# Patient Record
Sex: Male | Born: 1955 | Race: White | Hispanic: No | Marital: Married | Smoking: Former smoker
Health system: Southern US, Community
[De-identification: ages and names within clinical notes are randomized; demographics above are authoritative.]

## PROBLEM LIST (undated history)

## (undated) DIAGNOSIS — I213 ST elevation (STEMI) myocardial infarction of unspecified site: Secondary | ICD-10-CM

## (undated) DIAGNOSIS — K8309 Other cholangitis: Secondary | ICD-10-CM

## (undated) DIAGNOSIS — N289 Disorder of kidney and ureter, unspecified: Secondary | ICD-10-CM

## (undated) DIAGNOSIS — K8050 Calculus of bile duct without cholangitis or cholecystitis without obstruction: Secondary | ICD-10-CM

## (undated) DIAGNOSIS — E119 Type 2 diabetes mellitus without complications: Secondary | ICD-10-CM

## (undated) DIAGNOSIS — E079 Disorder of thyroid, unspecified: Secondary | ICD-10-CM

## (undated) DIAGNOSIS — T7840XA Allergy, unspecified, initial encounter: Secondary | ICD-10-CM

## (undated) DIAGNOSIS — I10 Essential (primary) hypertension: Secondary | ICD-10-CM

## (undated) DIAGNOSIS — I2510 Atherosclerotic heart disease of native coronary artery without angina pectoris: Secondary | ICD-10-CM

## (undated) DIAGNOSIS — D751 Secondary polycythemia: Secondary | ICD-10-CM

## (undated) DIAGNOSIS — M1990 Unspecified osteoarthritis, unspecified site: Secondary | ICD-10-CM

## (undated) DIAGNOSIS — Z87442 Personal history of urinary calculi: Secondary | ICD-10-CM

## (undated) DIAGNOSIS — E785 Hyperlipidemia, unspecified: Secondary | ICD-10-CM

## (undated) HISTORY — DX: Type 2 diabetes mellitus without complications: E11.9

## (undated) HISTORY — DX: Unspecified osteoarthritis, unspecified site: M19.90

## (undated) HISTORY — DX: Essential (primary) hypertension: I10

## (undated) HISTORY — DX: Disorder of thyroid, unspecified: E07.9

## (undated) HISTORY — DX: Hyperlipidemia, unspecified: E78.5

## (undated) HISTORY — DX: Atherosclerotic heart disease of native coronary artery without angina pectoris: I25.10

## (undated) HISTORY — PX: CHOLECYSTECTOMY: SHX55

## (undated) HISTORY — PX: COLONOSCOPY: SHX174

## (undated) HISTORY — DX: Allergy, unspecified, initial encounter: T78.40XA

## (undated) HISTORY — PX: UPPER GASTROINTESTINAL ENDOSCOPY: SHX188

## (undated) HISTORY — DX: Personal history of urinary calculi: Z87.442

---
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
## 1997-09-19 HISTORY — PX: KNEE ARTHROSCOPY: SHX127

## 2010-09-19 HISTORY — PX: CORONARY STENT PLACEMENT: SHX1402

## 2012-10-04 ENCOUNTER — Ambulatory Visit (HOSPITAL_COMMUNITY)
Admission: RE | Admit: 2012-10-04 | Discharge: 2012-10-04 | Disposition: A | Discharge status: Home / Self Care | Payer: BC Managed Care – PPO | Source: Ambulatory Visit | Attending: Internal Medicine | Admitting: Internal Medicine

## 2012-10-04 ENCOUNTER — Other Ambulatory Visit (HOSPITAL_COMMUNITY): Payer: Self-pay | Admitting: Internal Medicine

## 2012-10-04 DIAGNOSIS — R0602 Shortness of breath: Secondary | ICD-10-CM | POA: Insufficient documentation

## 2012-10-04 DIAGNOSIS — R05 Cough: Secondary | ICD-10-CM | POA: Insufficient documentation

## 2012-10-04 DIAGNOSIS — R059 Cough, unspecified: Secondary | ICD-10-CM | POA: Insufficient documentation

## 2013-07-30 DIAGNOSIS — I2510 Atherosclerotic heart disease of native coronary artery without angina pectoris: Secondary | ICD-10-CM | POA: Diagnosis present

## 2013-11-29 DIAGNOSIS — E119 Type 2 diabetes mellitus without complications: Secondary | ICD-10-CM

## 2018-02-28 ENCOUNTER — Encounter: Payer: Self-pay | Admitting: Internal Medicine

## 2018-04-04 ENCOUNTER — Encounter: Payer: Self-pay | Admitting: Nurse Practitioner

## 2018-04-20 ENCOUNTER — Ambulatory Visit (INDEPENDENT_AMBULATORY_CARE_PROVIDER_SITE_OTHER): Payer: BC Managed Care – PPO | Admitting: Nurse Practitioner

## 2018-04-20 ENCOUNTER — Encounter: Payer: Self-pay | Admitting: Nurse Practitioner

## 2018-04-20 ENCOUNTER — Telehealth: Payer: Self-pay

## 2018-04-20 VITALS — BP 120/74 | HR 78 | Ht 74.0 in | Wt 243.2 lb

## 2018-04-20 DIAGNOSIS — R194 Change in bowel habit: Secondary | ICD-10-CM

## 2018-04-20 DIAGNOSIS — Z1211 Encounter for screening for malignant neoplasm of colon: Secondary | ICD-10-CM

## 2018-04-20 DIAGNOSIS — Z7901 Long term (current) use of anticoagulants: Secondary | ICD-10-CM | POA: Diagnosis not present

## 2018-04-20 DIAGNOSIS — Z800 Family history of malignant neoplasm of digestive organs: Secondary | ICD-10-CM

## 2018-04-20 MED ORDER — NA SULFATE-K SULFATE-MG SULF 17.5-3.13-1.6 GM/177ML PO SOLN: ORAL | 0 refills | Status: DC

## 2018-04-20 NOTE — Progress Notes (Signed)
Primary GI:   New             Chief Complaint: Bowel changes   Referring Provider:     Antonietta Jewel, MD   ASSESSMENT AND PLAN;   71. 62 yo male with [facility] of colon cancer in mother age 49. Patient has never had a colonoscopy.  Patient will be scheduled for an initial screening colonoscopy, off Plavix. The risks and benefits of colonoscopy with possible polypectomy were discussed and the patient agrees to proceed.   2.  CAD / remote stent. On Plavix.  -Hold Plavix for 5 days before procedure - will instruct when and how to resume after procedure. Patient understands that there is a low but real risk of cardiovascular event such as heart attack, stroke, or embolism /  thrombosis, or ischemia while off Plavix. The patient consents to proceed. Will communicate by phone or EMR with patient's prescribing provider to confirm that holding Plavix is reasonable in this case.   3. BLE edema. No diuretics listed on home med list but upon furthering questioning patient thinks he takes one and will call us when checks meds at home.    4. Bowel changes. Stools softer than his norm and with some associated urgency.  -further evaluation at time of colonoscopy. No alarming features at present. His abdominal exam is benign, no unexplained weight loss , fevers, blood in stool, etc.. I develops diarrhea then will need stools studies.    HPI:    Patient is a 62 yo male with DM, HTN, CAD / stents on chronic plavix.  In the last two months his stools have become soft with associated urgency. He had a cholecytectomy May 2018 but bowel movements didn't change until just recently. He has no abdominal pain. No N/V to speak of. Weight is stable.  No antibiotics in at least a year. No new medcations / changes in routine meds. No dietary changes. On Meformin for 15 years. On chronic pain meds for knee.  Past Medical History:  Diagnosis Date  . Arthritis   . Coronary artery disease   . Diabetes ([place])   . History of  kidney stones   . Hypertension     Past Surgical History:  Procedure Laterality Date  . CHOLECYSTECTOMY    . CORONARY STENT PLACEMENT  2012   Family History  Problem Relation Age of Onset  . Colon cancer Mother   . Colon polyps Mother   . Kidney cancer Father   . Heart disease Father        Dads brother had heart disease  . Liver cancer Neg Hx   . Rectal cancer Neg Hx   . Esophageal cancer Neg Hx   . Stomach cancer Neg Hx    Social History   Tobacco Use  . Smoking status: Former Research scientist (life sciences)  . Smokeless tobacco: Never Used  Substance Use Topics  . Alcohol use: Not on file    Comment: social 5 drinks yearly  . Drug use: Never   Current Outpatient Medications  Medication Sig Dispense Refill  . atenolol (TENORMIN) 100 MG tablet Take 100 mg by mouth daily.    Marland Kitchen atorvastatin (LIPITOR) 80 MG tablet Take 80 mg by mouth daily.    . Cholecalciferol (VITAMIN D3) 50000 units TABS Take 50,000 Units by mouth once a week.    . clopidogrel (PLAVIX) 75 MG tablet Take 75 mg by mouth daily.    Marland Kitchen HYDROcodone-acetaminophen (NORCO) 10-325 MG tablet Take 1 tablet  by mouth every 6 (six) hours as needed.    . insulin detemir (LEVEMIR) 100 UNIT/ML injection Inject 50-100 Units into the skin daily.    Marland Kitchen lisinopril (PRINIVIL,ZESTRIL) 2.5 MG tablet Take 2.5 mg by mouth daily.    . metFORMIN (GLUCOPHAGE) 500 MG tablet Take by mouth 2 (two) times daily with a meal.    . testosterone cypionate (DEPOTESTOTERONE CYPIONATE) 100 MG/ML injection Inject 200 mg into the muscle every 14 (fourteen) days. For IM use only     No current facility-administered medications for this visit.    Allergies  Allergen Reactions  . Penicillins Rash    Review of Systems: All systems reviewed and negative except where noted in HPI.   Creatinine clearance cannot be calculated (No order found.)   Physical Exam:    Wt Readings from Last 3 Encounters:  04/20/18 243 lb 3.2 oz (110.3 kg)    BP 120/74   Pulse 78   Ht  6\' 2"  (1.88 m)   Wt 243 lb 3.2 oz (110.3 kg)   BMI 31.23 kg/m  Constitutional:  Pleasant male in no acute distress. Psychiatric: Normal mood and affect. Behavior is normal. EENT: Pupils normal.  Conjunctivae are normal. No scleral icterus. Neck supple.  Cardiovascular: Normal rate, regular rhythm.BLE 2-3+.  Pulmonary/chest: Effort normal and breath sounds normal. No wheezing, rales or rhonchi. Abdominal: Soft, nondistended, nontender. Bowel sounds active throughout. There are no masses palpable. No hepatomegaly. Neurological: Alert and oriented to person place and time. Skin: Skin is warm and dry. No rashes noted.  Tye Savoy, NP  04/20/2018, 2:03 PM   Cc: Antonietta Jewel, MD

## 2018-04-20 NOTE — Patient Instructions (Signed)
If you are age 60 or older, your body mass index should be between 23-30. Your Body mass index is 31.23 kg/m. If this is out of the aforementioned range listed, please consider follow up with your Primary Care Provider.  If you are age 41 or younger, your body mass index should be between 19-25. Your Body mass index is 31.23 kg/m. If this is out of the aformentioned range listed, please consider follow up with your Primary Care Provider.   You have been scheduled for a colonoscopy. Please follow written instructions given to you at your visit today.  Please pick up your prep supplies at the pharmacy within the next 1-3 days. If you use inhalers (even only as needed), please bring them with you on the day of your procedure. Your physician has requested that you go to www.startemmi.com and enter the access code given to you at your visit today. This web site gives a general overview about your procedure. However, you should still follow specific instructions given to you by our office regarding your preparation for the procedure.  We have sent the following medications to your pharmacy for you to pick up at your convenience: [facility] will be contacted by our office prior to your procedure for directions on holding your Plavix.  If you do not hear from our office 1 week prior to your scheduled procedure, please call [phone] to discuss.   Thank you for choosing me and [facility].   Tye Savoy, NP

## 2018-04-20 NOTE — Telephone Encounter (Signed)
[facility] [address] [place], [place]  [address] Phone:  [phone]   Fax:  [phone]  04/20/2018   RE:      Ricky Cabrera DOB:   07/25/1956 MRN:   [id number]   Dear Dr. Ralene Cork,    We have scheduled the above patient for an endoscopic procedure. Our records show that he is on anticoagulation therapy.   Please advise as to whether the patient may come off his therapy of Plavix 5 days prior to the colonoscopy procedure, which is scheduled for 05/03/18.  Please fax back/ or route to [place], [place] at [phone].   Sincerely,    Francesco Sor    PLEASE RESPOND ASAP! Thanks

## 2018-04-23 ENCOUNTER — Encounter: Payer: Self-pay | Admitting: Nurse Practitioner

## 2018-04-23 NOTE — Progress Notes (Signed)
Thank you for sending this case to me. I have reviewed the entire note, and the outlined plan seems appropriate.   Henry Danis, MD  

## 2018-04-25 ENCOUNTER — Encounter: Payer: BC Managed Care – PPO | Admitting: Gastroenterology

## 2018-04-26 ENCOUNTER — Telehealth: Payer: Self-pay

## 2018-04-26 NOTE — Telephone Encounter (Signed)
I have called patient and left voice mails on Monday, Tuesday, Wednesday and today requesting that he return my call regarding holding his Plavix.  I told him if I do not hear from back from him today, his procedure will be canceled for 05/03/18.  I called Dr. Ralene Cork office and got an alternate number [phone].  Left a voice mail requesting patient to call me today regarding holding his Plavix.  That I would have to cancel his procedure if I do not hear back today by 5 pm.

## 2018-04-26 NOTE — Progress Notes (Signed)
Patients wife returned my call regarding holding Plavix for five days.  I asked if I could speak with the patient.  She stated the patient wasn't at home.  I told her to please advise Mr Mccreadie to hold Plavix starting on Saturday and to hold until procedure. She verabalized understanding and stated she would let him know.  She also told me that patient is taking Furosemide 20 mg every morning.  I will add this to patients medication list.

## 2018-05-03 ENCOUNTER — Encounter: Payer: Self-pay | Admitting: Gastroenterology

## 2018-05-03 ENCOUNTER — Ambulatory Visit (AMBULATORY_SURGERY_CENTER): Payer: BC Managed Care – PPO | Admitting: Gastroenterology

## 2018-05-03 VITALS — BP 120/76 | HR 74 | Temp 96.2°F | Resp 19 | Ht 74.0 in | Wt 243.0 lb

## 2018-05-03 DIAGNOSIS — Z800 Family history of malignant neoplasm of digestive organs: Secondary | ICD-10-CM | POA: Diagnosis not present

## 2018-05-03 DIAGNOSIS — D125 Benign neoplasm of sigmoid colon: Secondary | ICD-10-CM | POA: Diagnosis not present

## 2018-05-03 DIAGNOSIS — D123 Benign neoplasm of transverse colon: Secondary | ICD-10-CM

## 2018-05-03 DIAGNOSIS — Z1211 Encounter for screening for malignant neoplasm of colon: Secondary | ICD-10-CM | POA: Diagnosis not present

## 2018-05-03 DIAGNOSIS — D124 Benign neoplasm of descending colon: Secondary | ICD-10-CM

## 2018-05-03 MED ORDER — SODIUM CHLORIDE 0.9 % IV SOLN: 500.0000 mL | Freq: Once | INTRAVENOUS | Status: DC

## 2018-05-03 NOTE — Progress Notes (Signed)
Called to room to assist during endoscopic procedure.  Patient ID and intended procedure confirmed with present staff. Received instructions for my participation in the procedure from the performing physician.  

## 2018-05-03 NOTE — Progress Notes (Signed)
Report given to PACU, vss 

## 2018-05-03 NOTE — Progress Notes (Signed)
History reviewed today 

## 2018-05-03 NOTE — Op Note (Signed)
[facility] Patient Name: Ricky Cabrera Procedure Date: 05/03/2018 9:03 AM MRN: [id number] Endoscopist: Mallie Mussel L. Loletha Carrow , MD Age: 62 Referring MD:  Date of Birth: 1956/08/09 Gender: Male Account #: [id number] Procedure:                Colonoscopy Indications:              Screening in patient at increased risk: Colorectal                            cancer in mother before age 44 Medicines:                Monitored Anesthesia Care Procedure:                Pre-Anesthesia Assessment:                           - Prior to the procedure, a History and Physical                            was performed, and patient medications and                            allergies were reviewed. The patient's tolerance of                            previous anesthesia was also reviewed. The risks                            and benefits of the procedure and the sedation                            options and risks were discussed with the patient.                            All questions were answered, and informed consent                            was obtained. Prior Anticoagulants: The patient has                            taken Plavix (clopidogrel), last dose was 5 days                            prior to procedure. ASA Grade Assessment: III - A                            patient with severe systemic disease. After                            reviewing the risks and benefits, the patient was                            deemed in satisfactory condition to undergo the  procedure.                           After obtaining informed consent, the colonoscope                            was passed under direct vision. Throughout the                            procedure, the patient's blood pressure, pulse, and                            oxygen saturations were monitored continuously. The                            Model CF-HQ190L [id number]) scope was introduced                             through the anus and advanced to the the cecum,                            identified by appendiceal orifice and ileocecal                            valve. The colonoscopy was performed with                            difficulty due to a redundant colon, significant                            looping and the patient's body habitus. The patient                            tolerated the procedure well. The quality of the                            bowel preparation was fair. The ileocecal valve,                            appendiceal orifice, and rectum were photographed.                            The quality of the bowel preparation was evaluated                            using the BBPS [facility] Bowel Preparation Scale)                            with scores of: Right Colon = 1, Transverse Colon =                            2 and Left Colon = 2. The total BBPS score equals  5. The bowel preparation used was SUPREP. Scope In: 9:09:17 AM Scope Out: 10:18:24 AM Scope Withdrawal Time: 1 hour 1 minute 40 seconds  Total Procedure Duration: 1 hour 9 minutes 7 seconds  Findings:                 The digital rectal exam findings include decreased                            sphincter tone.                           A 20 mm polyp was found in the hepatic flexure. The                            polyp was sessile. The polyp was removed with a hot                            snare. The polyp was removed with a piecemeal                            technique using a hot snare (challenging position                            and scope looping). Resection and retrieval were                            complete. To prevent bleeding post-intervention,                            one hemostatic clip was successfully placed (MR                            conditional). Area was successfully injected with 1                            mL Spot (carbon black) for tattooing several                             centimeters distal to the polypectomy site on the                            same wall. (Jar 1)                           Three sessile polyps were found in the transverse                            colon. The polyps were 3 to 6 mm in size. These                            polyps were removed with a cold snare. Resection  was complete, but the polyp tissue was only                            partially retrieved. (Jar 2)                           Four sessile polyps were found in the descending                            colon. The polyps were 4 to 6 mm in size. These                            polyps were removed with a cold snare. Resection                            and retrieval were complete. (Jar 3)                           A 8 mm polyp was found in the sigmoid colon. The                            polyp was semi-pedunculated. The polyp was removed                            with a hot snare. Resection and retrieval were                            complete. (Jar 4)                           A 8 mm polyp was found in the sigmoid colon. The                            polyp was sessile. The polyp was removed with a                            cold snare. Resection and retrieval were complete.                            (Jar 4)                           Multiple diverticula were found in the left colon. Complications:            No immediate complications. Estimated Blood Loss:     Estimated blood loss was minimal. Impression:               - Preparation of the colon was fair.                           - Decreased sphincter tone found on digital rectal                            exam.                           -  One 20 mm polyp at the hepatic flexure, removed                            with a hot snare and removed piecemeal using a hot                            snare. Resected and retrieved. Clip (MR                             conditional) was placed. Injected.                           - Three 3 to 6 mm polyps in the transverse colon,                            removed with a cold snare. Complete resection.                            Partial retrieval.                           - Four 4 to 6 mm polyps in the descending colon,                            removed with a cold snare. Resected and retrieved.                           - One 8 mm polyp in the sigmoid colon, removed with                            a hot snare. Resected and retrieved.                           - One 8 mm polyp in the sigmoid colon, removed with                            a cold snare. Resected and retrieved.                           - Diverticulosis in the left colon. Recommendation:           - Patient has a contact number available for                            emergencies. The signs and symptoms of potential                            delayed complications were discussed with the                            patient. Return to normal activities tomorrow.  Written discharge instructions were provided to the                            patient.                           - Resume previous diet.                           - Resume Plavix (clopidogrel) at prior dose in 5                            days.                           - Await pathology results.                           - Repeat colonoscopy in 6 months to review the                            polypectomy site, for surveillance after piecemeal                            polypectomy and for surveillance of multiple polyps                            and suboptimal prep. [name], MD 05/03/2018 10:30:59 AM This report has been signed electronically.

## 2018-05-03 NOTE — Patient Instructions (Signed)
Impression/Recommendations:  Polyp handout given to patient. Diverticulosis handout given to patient.  Resume previous diet. Resume Plavix (cloppidogrel) at prior dose in 5 days.  Resume on May 08, 2018.  Repeat colonoscopy in 6 months.  YOU HAD AN ENDOSCOPIC PROCEDURE TODAY AT [facility]:   Refer to the procedure report that was given to you for any specific questions about what was found during the examination.  If the procedure report does not answer your questions, please call your gastroenterologist to clarify.  If you requested that your care partner not be given the details of your procedure findings, then the procedure report has been included in a sealed envelope for you to review at your convenience later.  YOU SHOULD EXPECT: Some feelings of bloating in the abdomen. Passage of more gas than usual.  Walking can help get rid of the air that was put into your GI tract during the procedure and reduce the bloating. If you had a lower endoscopy (such as a colonoscopy or flexible sigmoidoscopy) you may notice spotting of blood in your stool or on the toilet paper. If you underwent a bowel prep for your procedure, you may not have a normal bowel movement for a few days.  Please Note:  You might notice some irritation and congestion in your nose or some drainage.  This is from the oxygen used during your procedure.  There is no need for concern and it should clear up in a day or so.  SYMPTOMS TO REPORT IMMEDIATELY:   Following lower endoscopy (colonoscopy or flexible sigmoidoscopy):  Excessive amounts of blood in the stool  Significant tenderness or worsening of abdominal pains  Swelling of the abdomen that is new, acute  Fever of 100F or higher  For urgent or emergent issues, a gastroenterologist can be reached at any hour by calling [phone].   DIET:  We do recommend a small meal at first, but then you may proceed to your regular diet.  Drink plenty of  fluids but you should avoid alcoholic beverages for 24 hours.  ACTIVITY:  You should plan to take it easy for the rest of today and you should NOT DRIVE or use heavy machinery until tomorrow (because of the sedation medicines used during the test).    FOLLOW UP: Our staff will call the number listed on your records the next business day following your procedure to check on you and address any questions or concerns that you may have regarding the information given to you following your procedure. If we do not reach you, we will leave a message.  However, if you are feeling well and you are not experiencing any problems, there is no need to return our call.  We will assume that you have returned to your regular daily activities without incident.  If any biopsies were taken you will be contacted by phone or by letter within the next 1-3 weeks.  Please call us at [phone] if you have not heard about the biopsies in 3 weeks.    SIGNATURES/CONFIDENTIALITY: You and/or your care partner have signed paperwork which will be entered into your electronic medical record.  These signatures attest to the fact that that the information above on your After Visit Summary has been reviewed and is understood.  Full responsibility of the confidentiality of this discharge information lies with you and/or your care-partner.

## 2018-05-04 ENCOUNTER — Telehealth: Payer: Self-pay | Admitting: *Deleted

## 2018-05-04 ENCOUNTER — Telehealth: Payer: Self-pay

## 2018-05-04 NOTE — Telephone Encounter (Signed)
Left message on answering machine. 

## 2018-05-04 NOTE — Telephone Encounter (Signed)
  Follow up Call-  Call back number 05/03/2018  Post procedure Call Back phone  # [phone]  Permission to leave phone message Yes  Some recent data might be hidden     Patient questions:  Do you have a fever, pain , or abdominal swelling? No. Pain Score  0 *  Have you tolerated food without any problems? Yes.    Have you been able to return to your normal activities? Yes.    Do you have any questions about your discharge instructions: Diet   No. Medications  No. Follow up visit  No.  Do you have questions or concerns about your Care? No.  Actions: * If pain score is 4 or above: No action needed, pain <4.

## 2018-05-08 ENCOUNTER — Encounter: Payer: Self-pay | Admitting: Gastroenterology

## 2018-11-17 ENCOUNTER — Encounter: Payer: Self-pay | Admitting: Gastroenterology

## 2019-05-24 HISTORY — PX: OTHER SURGICAL HISTORY: SHX169

## 2019-06-19 ENCOUNTER — Ambulatory Visit: Payer: BC Managed Care – PPO | Admitting: Physician Assistant

## 2019-06-19 ENCOUNTER — Other Ambulatory Visit: Payer: Self-pay

## 2019-06-19 ENCOUNTER — Telehealth: Payer: Self-pay

## 2019-06-19 ENCOUNTER — Encounter (HOSPITAL_COMMUNITY): Payer: Self-pay

## 2019-06-19 ENCOUNTER — Observation Stay (HOSPITAL_COMMUNITY): Payer: BC Managed Care – PPO

## 2019-06-19 ENCOUNTER — Encounter: Payer: Self-pay | Admitting: Physician Assistant

## 2019-06-19 ENCOUNTER — Inpatient Hospital Stay (HOSPITAL_COMMUNITY)
Admission: EM | Admit: 2019-06-19 | Discharge: 2019-06-25 | DRG: 445 | Disposition: A | Discharge status: Home / Self Care | Payer: BC Managed Care – PPO | Source: Ambulatory Visit | Attending: Family Medicine | Admitting: Family Medicine

## 2019-06-19 ENCOUNTER — Ambulatory Visit (HOSPITAL_COMMUNITY): Payer: BC Managed Care – PPO

## 2019-06-19 ENCOUNTER — Other Ambulatory Visit (INDEPENDENT_AMBULATORY_CARE_PROVIDER_SITE_OTHER): Payer: BC Managed Care – PPO

## 2019-06-19 VITALS — BP 100/60 | HR 78 | Temp 98.6°F | Ht 74.0 in | Wt 205.0 lb

## 2019-06-19 DIAGNOSIS — G8929 Other chronic pain: Secondary | ICD-10-CM | POA: Diagnosis present

## 2019-06-19 DIAGNOSIS — D751 Secondary polycythemia: Secondary | ICD-10-CM | POA: Diagnosis present

## 2019-06-19 DIAGNOSIS — E119 Type 2 diabetes mellitus without complications: Secondary | ICD-10-CM

## 2019-06-19 DIAGNOSIS — I152 Hypertension secondary to endocrine disorders: Secondary | ICD-10-CM

## 2019-06-19 DIAGNOSIS — E1159 Type 2 diabetes mellitus with other circulatory complications: Secondary | ICD-10-CM

## 2019-06-19 DIAGNOSIS — Z87891 Personal history of nicotine dependence: Secondary | ICD-10-CM

## 2019-06-19 DIAGNOSIS — Z79891 Long term (current) use of opiate analgesic: Secondary | ICD-10-CM

## 2019-06-19 DIAGNOSIS — Z7902 Long term (current) use of antithrombotics/antiplatelets: Secondary | ICD-10-CM

## 2019-06-19 DIAGNOSIS — I2510 Atherosclerotic heart disease of native coronary artery without angina pectoris: Secondary | ICD-10-CM | POA: Diagnosis present

## 2019-06-19 DIAGNOSIS — K5900 Constipation, unspecified: Secondary | ICD-10-CM | POA: Diagnosis present

## 2019-06-19 DIAGNOSIS — Z8371 Family history of colonic polyps: Secondary | ICD-10-CM

## 2019-06-19 DIAGNOSIS — Z794 Long term (current) use of insulin: Secondary | ICD-10-CM

## 2019-06-19 DIAGNOSIS — K8030 Calculus of bile duct with cholangitis, unspecified, without obstruction: Principal | ICD-10-CM | POA: Diagnosis present

## 2019-06-19 DIAGNOSIS — Z8249 Family history of ischemic heart disease and other diseases of the circulatory system: Secondary | ICD-10-CM

## 2019-06-19 DIAGNOSIS — R7401 Elevation of levels of liver transaminase levels: Secondary | ICD-10-CM | POA: Diagnosis present

## 2019-06-19 DIAGNOSIS — Z20828 Contact with and (suspected) exposure to other viral communicable diseases: Secondary | ICD-10-CM | POA: Diagnosis present

## 2019-06-19 DIAGNOSIS — E871 Hypo-osmolality and hyponatremia: Secondary | ICD-10-CM | POA: Diagnosis present

## 2019-06-19 DIAGNOSIS — Z880 Allergy status to penicillin: Secondary | ICD-10-CM

## 2019-06-19 DIAGNOSIS — Z8051 Family history of malignant neoplasm of kidney: Secondary | ICD-10-CM

## 2019-06-19 DIAGNOSIS — R945 Abnormal results of liver function studies: Secondary | ICD-10-CM

## 2019-06-19 DIAGNOSIS — E875 Hyperkalemia: Secondary | ICD-10-CM | POA: Diagnosis present

## 2019-06-19 DIAGNOSIS — K8050 Calculus of bile duct without cholangitis or cholecystitis without obstruction: Secondary | ICD-10-CM | POA: Diagnosis present

## 2019-06-19 DIAGNOSIS — R7989 Other specified abnormal findings of blood chemistry: Secondary | ICD-10-CM

## 2019-06-19 DIAGNOSIS — Z800 Family history of malignant neoplasm of digestive organs: Secondary | ICD-10-CM

## 2019-06-19 DIAGNOSIS — E1169 Type 2 diabetes mellitus with other specified complication: Secondary | ICD-10-CM | POA: Diagnosis present

## 2019-06-19 DIAGNOSIS — Z955 Presence of coronary angioplasty implant and graft: Secondary | ICD-10-CM

## 2019-06-19 DIAGNOSIS — K8309 Other cholangitis: Secondary | ICD-10-CM | POA: Diagnosis present

## 2019-06-19 DIAGNOSIS — Z9049 Acquired absence of other specified parts of digestive tract: Secondary | ICD-10-CM

## 2019-06-19 DIAGNOSIS — Z87442 Personal history of urinary calculi: Secondary | ICD-10-CM

## 2019-06-19 DIAGNOSIS — R740 Nonspecific elevation of levels of transaminase and lactic acid dehydrogenase [LDH]: Secondary | ICD-10-CM | POA: Diagnosis not present

## 2019-06-19 HISTORY — DX: Disorder of kidney and ureter, unspecified: N28.9

## 2019-06-19 HISTORY — DX: ST elevation (STEMI) myocardial infarction of unspecified site: I21.3

## 2019-06-19 LAB — CBC
HCT: 53.1 % — ABNORMAL HIGH (ref 39.0–52.0)
Hemoglobin: 17.6 g/dL — ABNORMAL HIGH (ref 13.0–17.0)
MCH: 30.8 pg (ref 26.0–34.0)
MCHC: 33.1 g/dL (ref 30.0–36.0)
MCV: 92.8 fL (ref 80.0–100.0)
Platelets: 185 10*3/uL (ref 150–400)
RBC: 5.72 MIL/uL (ref 4.22–5.81)
RDW: 15.1 % (ref 11.5–15.5)
WBC: 11.8 10*3/uL — ABNORMAL HIGH (ref 4.0–10.5)
nRBC: 0 % (ref 0.0–0.2)

## 2019-06-19 LAB — COMPREHENSIVE METABOLIC PANEL
ALT: 180 U/L — ABNORMAL HIGH (ref 0–53)
ALT: 178 U/L — ABNORMAL HIGH (ref 0–44)
AST: 102 U/L — ABNORMAL HIGH (ref 0–37)
AST: 96 U/L — ABNORMAL HIGH (ref 15–41)
Albumin: 4.2 g/dL (ref 3.5–5.2)
Albumin: 3.7 g/dL (ref 3.5–5.0)
Alkaline Phosphatase: 376 U/L — ABNORMAL HIGH (ref 39–117)
Alkaline Phosphatase: 319 U/L — ABNORMAL HIGH (ref 38–126)
Anion gap: 12 (ref 5–15)
BUN: 21 mg/dL (ref 6–23)
BUN: 23 mg/dL (ref 8–23)
CO2: 26 mEq/L (ref 19–32)
CO2: 22 mmol/L (ref 22–32)
Calcium: 10.1 mg/dL (ref 8.4–10.5)
Calcium: 9.4 mg/dL (ref 8.9–10.3)
Chloride: 97 mEq/L (ref 96–112)
Chloride: 98 mmol/L (ref 98–111)
Creatinine, Ser: 1.13 mg/dL (ref 0.40–1.50)
Creatinine, Ser: 0.96 mg/dL (ref 0.61–1.24)
GFR calc Af Amer: >60 mL/min (ref >60)
GFR calc non Af Amer: >60 mL/min (ref >60)
GFR: 65.96 mL/min (ref >60.00)
Glucose, Bld: 221 mg/dL — ABNORMAL HIGH (ref 70–99)
Glucose, Bld: 163 mg/dL — ABNORMAL HIGH (ref 70–99)
Potassium: 4.5 mEq/L (ref 3.5–5.1)
Potassium: 5.3 mmol/L — ABNORMAL HIGH (ref 3.5–5.1)
Sodium: 133 mEq/L — ABNORMAL LOW (ref 135–145)
Sodium: 132 mmol/L — ABNORMAL LOW (ref 135–145)
Total Bilirubin: 11.1 mg/dL — ABNORMAL HIGH (ref 0.2–1.2)
Total Bilirubin: 9.4 mg/dL — ABNORMAL HIGH (ref 0.3–1.2)
Total Protein: 7.6 g/dL (ref 6.0–8.3)
Total Protein: 7.5 g/dL (ref 6.5–8.1)

## 2019-06-19 LAB — CBC WITH DIFFERENTIAL/PLATELET
Basophils Absolute: 0.1 10*3/uL (ref 0.0–0.1)
Basophils Relative: 0.8 % (ref 0.0–3.0)
Eosinophils Absolute: 0.2 10*3/uL (ref 0.0–0.7)
Eosinophils Relative: 1.8 % (ref 0.0–5.0)
HCT: 53 % — ABNORMAL HIGH (ref 39.0–52.0)
Hemoglobin: 17.8 g/dL — ABNORMAL HIGH (ref 13.0–17.0)
Lymphocytes Relative: 13.8 % (ref 12.0–46.0)
Lymphs Abs: 1.7 10*3/uL (ref 0.7–4.0)
MCHC: 33.7 g/dL (ref 30.0–36.0)
MCV: 92.1 fl (ref 78.0–100.0)
Monocytes Absolute: 1.1 10*3/uL — ABNORMAL HIGH (ref 0.1–1.0)
Monocytes Relative: 8.5 % (ref 3.0–12.0)
Neutro Abs: 9.5 10*3/uL — ABNORMAL HIGH (ref 1.4–7.7)
Neutrophils Relative %: 75.1 % (ref 43.0–77.0)
Platelets: 188 10*3/uL (ref 150.0–400.0)
RBC: 5.75 Mil/uL (ref 4.22–5.81)
RDW: 15.1 % (ref 11.5–15.5)
WBC: 12.6 10*3/uL — ABNORMAL HIGH (ref 4.0–10.5)

## 2019-06-19 LAB — LIPASE, BLOOD: Lipase: 28 U/L (ref 11–51)

## 2019-06-19 MED ORDER — ATENOLOL 50 MG PO TABS
100.0000 mg | ORAL_TABLET | Freq: Every day | ORAL | Status: DC
  Administered 2019-06-20 – 2019-06-25 (×6): 100 mg via ORAL
  Filled 2019-06-19 (×6): qty 2

## 2019-06-19 MED ORDER — METRONIDAZOLE IN NACL 5-0.79 MG/ML-% IV SOLN
500.0000 mg | Freq: Three times a day (TID) | INTRAVENOUS | Status: DC
  Administered 2019-06-20 – 2019-06-25 (×18): 500 mg via INTRAVENOUS
  Filled 2019-06-19 (×17): qty 100

## 2019-06-19 MED ORDER — SODIUM CHLORIDE 0.9% FLUSH: 3.0000 mL | Freq: Once | INTRAVENOUS | Status: DC

## 2019-06-19 MED ORDER — IOHEXOL 300 MG/ML  SOLN
100.0000 mL | Freq: Once | INTRAMUSCULAR | Status: AC | PRN
  Administered 2019-06-19: 100 mL via INTRAVENOUS

## 2019-06-19 MED ORDER — SODIUM CHLORIDE (PF) 0.9 % IJ SOLN
INTRAMUSCULAR | Status: AC
  Filled 2019-06-19: qty 50

## 2019-06-19 MED ORDER — SENNOSIDES-DOCUSATE SODIUM 8.6-50 MG PO TABS: 1.0000 | ORAL_TABLET | Freq: Every evening | ORAL | Status: DC | PRN

## 2019-06-19 MED ORDER — INSULIN ASPART 100 UNIT/ML ~~LOC~~ SOLN
0.0000 [IU] | Freq: Three times a day (TID) | SUBCUTANEOUS | Status: DC
  Administered 2019-06-20: 3 [IU] via SUBCUTANEOUS
  Administered 2019-06-20 (×2): 5 [IU] via SUBCUTANEOUS
  Administered 2019-06-21: 3 [IU] via SUBCUTANEOUS
  Administered 2019-06-21: 5 [IU] via SUBCUTANEOUS
  Administered 2019-06-21 – 2019-06-22 (×2): 3 [IU] via SUBCUTANEOUS
  Administered 2019-06-22: 5 [IU] via SUBCUTANEOUS
  Administered 2019-06-22: 8 [IU] via SUBCUTANEOUS
  Administered 2019-06-23: 3 [IU] via SUBCUTANEOUS
  Administered 2019-06-23: 5 [IU] via SUBCUTANEOUS
  Administered 2019-06-23: 3 [IU] via SUBCUTANEOUS
  Administered 2019-06-24 (×3): 5 [IU] via SUBCUTANEOUS
  Administered 2019-06-25 (×2): 3 [IU] via SUBCUTANEOUS
  Filled 2019-06-19: qty 0.15

## 2019-06-19 MED ORDER — SODIUM CHLORIDE 0.9 % IV SOLN
INTRAVENOUS | Status: AC
  Administered 2019-06-19: via INTRAVENOUS

## 2019-06-19 MED ORDER — LISINOPRIL 5 MG PO TABS
2.5000 mg | ORAL_TABLET | Freq: Every day | ORAL | Status: DC
  Administered 2019-06-20 – 2019-06-25 (×6): 2.5 mg via ORAL
  Filled 2019-06-19 (×6): qty 1

## 2019-06-19 MED ORDER — OXYCODONE HCL 5 MG PO TABS
10.0000 mg | ORAL_TABLET | Freq: Three times a day (TID) | ORAL | Status: DC | PRN
  Administered 2019-06-20 – 2019-06-25 (×15): 10 mg via ORAL
  Filled 2019-06-19 (×17): qty 2

## 2019-06-19 MED ORDER — CIPROFLOXACIN IN D5W 400 MG/200ML IV SOLN
400.0000 mg | Freq: Two times a day (BID) | INTRAVENOUS | Status: DC
  Administered 2019-06-19 – 2019-06-22 (×6): 400 mg via INTRAVENOUS
  Filled 2019-06-19 (×6): qty 200

## 2019-06-19 MED ORDER — POLYETHYLENE GLYCOL 3350 17 G PO PACK: 17.0000 g | PACK | Freq: Every day | ORAL | Status: DC | PRN

## 2019-06-19 MED ORDER — ONDANSETRON HCL 4 MG/2ML IJ SOLN
4.0000 mg | Freq: Four times a day (QID) | INTRAMUSCULAR | Status: DC | PRN
  Administered 2019-06-23: 4 mg via INTRAVENOUS
  Filled 2019-06-19: qty 2

## 2019-06-19 MED ORDER — ONDANSETRON HCL 4 MG PO TABS: 4.0000 mg | ORAL_TABLET | Freq: Four times a day (QID) | ORAL | Status: DC | PRN

## 2019-06-19 NOTE — ED Triage Notes (Signed)
Patient reports that he has been having aominal pain x 6 months, emesis x 1 month and intermittent constipation x 2 months. Today patient was suppose to have a CT scan today, but was told to come to the ED by his physician due to abnormal lab work.

## 2019-06-19 NOTE — Progress Notes (Signed)
____________________________________________________________  Attending physician addendum:  Thank you for sending this case to me. I have reviewed the entire note, and have also looked at some prior records with a GI provider in the [facility] system in Home from May 2018 and today's lab notes.   In 2018, the patient was found to have choledocholithiasis on intraoperative cholangiogram and then evaluated by GI provider in [facility].  The notes seem to indicate that his LFTs normalized, so they thought perhaps the stones had been passed and therefore opted not to do an ERCP.  He almost certainly has high-grade extrahepatic biliary obstruction from choledocholithiasis right now, and possibly cholangitis with a white blood cell count of 12.6 today and a bilirubin of 11.  He is a complex patient with underlying heart condition.  I will speak with our office staff, have them contact the patient immediately and refer him to the emergency department.  When you see this message, please check with the staff to see that they have done so.  Wilfrid Lund, MD  ____________________________________________________________

## 2019-06-19 NOTE — H&P (Addendum)
History and Physical    Ricky Cabrera IHK:[id number] DOB: 06-23-1956 DOA: 06/19/2019  PCP: Antonietta Jewel, MD  Patient coming from: Home  I have personally briefly reviewed patient's old medical records in [place]  Chief Complaint: Jaundice, abnormal liver testing   HPI: Ricky Cabrera is a 63 y.o. male with medical history significant for CAD s/p DES to LAD 11/2013, IDT2DM, hypertension, history of choledocholithiasis s/p laparoscopic cholecystectomy 01/18/2017 who presents to the ED for evaluation of intermittent jaundice and abnormal liver function testing.  Patient reports several months of intermittent central abdominal pain associated with episodes of nausea and vomiting as well as jaundice.  He has had recently worsening symptoms over the last week or 2.  He was seen by his [place] [facility] provider on day of admission and was found to have elevated LFTs.  He was advised to present to the ED for further evaluation due to concern for extrahepatic biliary obstruction from choledocholithiasis.  Patient otherwise also reports constipation with last bowel movement morning of admission.  He denies any diarrhea.  He denies any fevers, chills, chest pain, dyspnea, or dysuria.  He states he has been taken off of his metformin and atorvastatin by his PCP due to concern for effect on the liver.  ED Course:  Initial vitals showed BP 141/85, pulse 78, RR 16, temp 98.8 Fahrenheit, SPO2 98% on room air.  Labs notable for AST 96, ALT 178, alk phos 319, total bilirubin 9.4, BUN 23, creatinine 0.96, potassium 5.3, serum glucose 163, WBC 11.8, hemoglobin 17.6, platelets 185,000, lipase 28.  EDP discussed the case with on-call GI who recommended CT abdomen/pelvis with contrast for further evaluation and medical admission for potential ERCP tomorrow.  The hospitalist service was consulted to admit for further evaluation and management.   Review of Systems: All systems reviewed and are negative except as  documented in history of present illness above.   Past Medical History:  Diagnosis Date  . Arthritis   . Coronary artery disease   . Diabetes ([place])   . History of kidney stones   . Hypertension   . Renal disorder     Past Surgical History:  Procedure Laterality Date  . CHOLECYSTECTOMY    . CORONARY STENT PLACEMENT  2012  . KNEE ARTHROSCOPY Bilateral 1999  . thyroid biopsy  05/24/2019   benign    Social History:  reports that he has quit smoking. His smoking use included cigarettes. He has never used smokeless tobacco. He reports previous alcohol use. He reports that he does not use drugs.  Allergies  Allergen Reactions  . Penicillins Rash    Family History  Problem Relation Age of Onset  . Colon polyps Mother   . Colon cancer Mother        age  37  . Kidney cancer Father   . Heart disease Father        Dads brothers had heart disease  . Liver cancer Neg Hx   . Rectal cancer Neg Hx   . Esophageal cancer Neg Hx   . Stomach cancer Neg Hx      Prior to Admission medications   Medication Sig Start Date End Date Taking? Authorizing Provider  atenolol (TENORMIN) 100 MG tablet Take 100 mg by mouth daily.   Yes [provider]  clopidogrel (PLAVIX) 75 MG tablet Take 75 mg by mouth daily.   Yes [provider]  docusate sodium (COLACE) 50 MG capsule Take 100 mg by mouth 3 (three)  times daily.   Yes [provider]  furosemide (LASIX) 20 MG tablet Take 20 mg by mouth every morning. 02/08/18  Yes [provider]  insulin detemir (LEVEMIR) 100 UNIT/ML injection Inject 50-100 Units into the skin daily.   Yes [provider]  lisinopril (PRINIVIL,ZESTRIL) 2.5 MG tablet Take 2.5 mg by mouth daily.   Yes [provider]  Oxycodone HCl 10 MG TABS Take 1 tablet by mouth 3 (three) times daily. 06/10/19  Yes [provider]  Vitamin D, Ergocalciferol, (DRISDOL) 50000 units CAPS capsule Take 50,000 Units by mouth every 7  (seven) days.  04/06/18  Yes [provider]  sildenafil (VIAGRA) 100 MG tablet Take 100 mg by mouth every other day. 06/10/19   [provider]    Physical Exam: Vitals:   06/19/19 2025 06/19/19 2026 06/19/19 2100 06/19/19 2200  BP: 139/76 139/76 (!) 141/85 126/69  Pulse:  74 78 76  Resp:  '16 16 16  ' Temp:      TempSrc:      SpO2:  99% 98% 97%  Weight:      Height:        Constitutional: Resting supine in bed, NAD, calm, comfortable Eyes: PERRL, scleral icterus present ENMT: Mucous membranes are moist. Posterior pharynx clear of any exudate or lesions.Normal dentition.  Neck: normal, supple, no masses. Respiratory: clear to auscultation bilaterally, no wheezing, no crackles. Normal respiratory effort. No accessory muscle use.  Cardiovascular: Regular rate and rhythm, no murmurs / rubs / gallops. No extremity edema. Abdomen: no tenderness, no masses palpated. Bowel sounds positive.  Musculoskeletal: no clubbing / cyanosis. No joint deformity upper and lower extremities. Good ROM, no contractures. Normal muscle tone.  Skin: no rashes, lesions, ulcers. No induration Neurologic: CN 2-12 grossly intact. Sensation intact, Strength 5/5 in all 4.  Psychiatric: Normal judgment and insight. Alert and oriented x 3. Normal mood.     Labs on Admission: I have personally reviewed following labs and imaging studies  CBC: Recent Labs  Lab 06/19/19 1024 06/19/19 1704  WBC 12.6* 11.8*  NEUTROABS 9.5*  --   HGB 17.8* 17.6*  HCT 53.0* 53.1*  MCV 92.1 92.8  PLT 188.0 546   Basic Metabolic Panel: Recent Labs  Lab 06/19/19 1024 06/19/19 1704  NA 133* 132*  K 4.5 5.3*  CL 97 98  CO2 26 22  GLUCOSE 221* 163*  BUN 21 23  CREATININE 1.13 0.96  CALCIUM 10.1 9.4   GFR: Estimated Creatinine Clearance: 93.9 mL/min (by C-G formula based on SCr of 0.96 mg/dL). Liver Function Tests: Recent Labs  Lab 06/19/19 1024 06/19/19 1704  AST 102* 96*  ALT 180* 178*  ALKPHOS  376* 319*  BILITOT 11.1* 9.4*  PROT 7.6 7.5  ALBUMIN 4.2 3.7   Recent Labs  Lab 06/19/19 1704  LIPASE 28   No results for input(s): AMMONIA in the last 168 hours. Coagulation Profile: No results for input(s): INR, PROTIME in the last 168 hours. Cardiac Enzymes: No results for input(s): CKTOTAL, CKMB, CKMBINDEX, TROPONINI in the last 168 hours. BNP (last 3 results) No results for input(s): PROBNP in the last 8760 hours. HbA1C: No results for input(s): HGBA1C in the last 72 hours. CBG: No results for input(s): GLUCAP in the last 168 hours. Lipid Profile: No results for input(s): CHOL, HDL, LDLCALC, TRIG, CHOLHDL, LDLDIRECT in the last 72 hours. Thyroid Function Tests: No results for input(s): TSH, T4TOTAL, FREET4, T3FREE, THYROIDAB in the last 72 hours. Anemia Panel: No  results for input(s): VITAMINB12, FOLATE, FERRITIN, TIBC, IRON, RETICCTPCT in the last 72 hours. Urine analysis: No results found for: COLORURINE, APPEARANCEUR, LABSPEC, PHURINE, GLUCOSEU, HGBUR, BILIRUBINUR, KETONESUR, PROTEINUR, UROBILINOGEN, NITRITE, LEUKOCYTESUR  Radiological Exams on Admission: Ct Abdomen Pelvis W Contrast  Result Date: 06/19/2019 CLINICAL DATA:  63 year old male with known cholangitis presenting for follow-up. EXAM: CT ABDOMEN AND PELVIS WITH CONTRAST TECHNIQUE: Multidetector CT imaging of the abdomen and pelvis was performed using the standard protocol following bolus administration of intravenous contrast. CONTRAST:  147m OMNIPAQUE IOHEXOL 300 MG/ML  SOLN COMPARISON:  Abdominal ultrasound dated 01/12/2017 FINDINGS: Lower chest: The visualized lung bases are clear. Advanced multi vessel coronary vascular calcification noted. Intra-abdominal free air or free fluid. Hepatobiliary: Apparent fatty infiltration of the liver. There is dilatation of the intrahepatic biliary tree as well as common bile duct. Several noncalcified adjacent stones noted in the central CBD with a combined length of 18 mm  (coronal series 5 image 76). The common bile duct measures approximately 11 mm in diameter. Mild haziness of the fat surrounding the biliary tree concerning for superimposed infection/cholangitis. Further evaluation with MRCP is recommended. Pancreas: Unremarkable. No pancreatic ductal dilatation or surrounding inflammatory changes. Spleen: Normal in size without focal abnormality. Adrenals/Urinary Tract: The adrenal glands are unremarkable. There is no hydronephrosis on either side. There is symmetric enhancement and excretion of contrast by both kidneys. Several hypodense lesion in the left kidney measure up to 1.5 cm in the inferior pole. The larger lesions demonstrate fluid attenuation most consistent with cysts and the smaller lesions are too small to characterize. Ultrasound may provide better evaluation on a nonemergent basis. The visualized ureters and urinary bladder appear unremarkable. Stomach/Bowel: There is no bowel obstruction or active inflammation. The appendix is normal. Vascular/Lymphatic: Moderate aortoiliac atherosclerotic disease. The IVC is grossly unremarkable. No portal venous gas. There is no adenopathy. Reproductive: The prostate and seminal vesicles are grossly unremarkable. No pelvic mass Other: Diffuse skin thickening and stranding of the subcutaneous soft tissues of the anterior abdominal wall. No fluid collection. Musculoskeletal: Degenerative changes of the spine. No acute osseous pathology. IMPRESSION: 1. Choledocholithiasis with dilatation of the biliary tree and findings concerning for superimposed cholangitis. Further evaluation with MRCP is recommended. 2. No bowel obstruction or active inflammation. Normal appendix. Aortic Atherosclerosis (ICD10-I70.0). Electronically Signed   By: AAnner CreteM.D.   On: 06/19/2019 22:55    EKG: Not performed.  Assessment/Plan Principal Problem:   Transaminitis Active Problems:   CAD (coronary artery disease)   Diabetes mellitus  ([place]   Hypertension associated with diabetes ([place]  ames [place] a 63y.o. male with medical history significant for CAD s/p DES to LAD 11/2013, IDT2DM, hypertension, history of choledocholithiasis s/p laparoscopic cholecystectomy 01/18/2017 who is admitted with transaminitis associated with intermittent jaundice/N/V/abdominal pain.   Transaminitis with intermittent jaundice/N/V/abdominal pain: Concern for extrahepatic choledocholithiasis.  He is otherwise afebrile and hemodynamically stable. -Follow-up CT A/P with contrast -GI consulted by EDP and will see in a.m. -Continue supportive care with IV fluids overnight, antiemetics, and pain control as needed -Will keep n.p.o. at midnight  -ADDENDUM: CT shows choledocholithiasis with dilatation of the biliary tree and findings concerning for superimposed cholangitis.  Will add empiric antibiotics with IV ciprofloxacin and metronidazole (reported penicillin allergy and unclear if has tolerated cephalosporins in the past.  CAD s/p DES to LAD 11/2013: Currently stable without chest pain.  Has been taken off atorvastatin by PCP due to liver dysfunction.  Will hold home Plavix for now.  Hyperkalemia: Mild, anticipate this will improve with maintenance IV fluids overnight.  IDT2DM: Currently takes Levemir 50-100 units every a.m. on sliding scale.  Has been taken off metformin by PCP recently. -Check A1c -Use moderate SSI for now and adjust as needed  Hypertension: Continue atenolol and lisinopril.  Chronic osteoarthritis pain: Continue home oxycodone, changed to as needed while in hospital continue bowel regimen.  DVT prophylaxis: SCDs Code Status: Full code, confirmed with patient Family Communication: Discussed with wife at bedside Disposition Plan: Likely discharge to home pending clinical progress Consults called: EDP discussed with on-call [facility] Admission status: Observation   Zada Finders MD [facility]  If  7PM-7AM, please contact night-coverage [email or web address]  06/19/2019, 11:08 PM

## 2019-06-19 NOTE — ED Notes (Signed)
CURRENTLY ON PHONE 

## 2019-06-19 NOTE — Telephone Encounter (Signed)
Attempted to reach patient via phone. Left message to please call back, and if he is not able to reach me that Ellouise Newer PA wants him to go to the ED

## 2019-06-19 NOTE — Telephone Encounter (Signed)
Patient's wife called they are in the hospital parking lot. Told her to have her husband go straight to the ED, not to Radiology

## 2019-06-19 NOTE — Progress Notes (Addendum)
Chief Complaint: Jaundice, Recall colonoscopy  HPI:    Ricky Cabrera is a 63 year old Caucasian male with a past medical history of diabetes and CAD on Plavix, known to Dr. Loletha Carrow for his last colonoscopy, who returns to clinic today for recall colonoscopy in a complaint of jaundice.    05/17/2019 alk phos 157, AST 46, ALT 110, total bilirubin 1.5.  CBC shows a hemoglobin of 20.4.  He was told to hold his testosterone.    05/03/2018 colonoscopy done for a family history of colon cancer in his mother diagnosed in her before the age of 54.  This showed decreased sphincter tone on digital rectal exam, 120 mm polyp at the [facility] flexure removed with hot snare in piecemeal, clipped and injected, 3 3-6 mm polyps in the transverse colon, 4 4 to 6 mm polyps in the descending colon, 1 8 mm polyp in the sigmoid colon, 1 8 mm polyp in the sigmoid colon, diverticulosis in the left colon.  Repeat was recommended in 6 months to review the polypectomy site for surveillance after piecemeal polypectomy and first for surveillance of multiple polyps and suboptimal prep.  Pathology showed tubular adenomas.    Today, the patient presents to clinic and explains that he has had 2 episodes of jaundice over the past month.  Initially went to see his PCP on 8/28 and was "yellow".  Patient tells me that just prior to this he had excruciating abdominal pain worse in his epigastrium rated as a 10 out of 10 which made him bend over associated with nausea and vomiting as well as constipation.  After about 4 to 5 days this got better and his yellowness started to go away.  He was then okay for a few weeks until 9/21 when he became jaundiced again and had a similar constellation of symptoms with epigastric abdominal discomfort rated as a 10 out of 10 with nausea and vomiting and constipation.  Currently he is still jaundiced but no longer having any discomfort.  Tells me that it felt like "a ball was inside me that would move up under my  ribs".  Describes taking simethicone which seemed to make it some better.  When he initially saw his PCP apparently they discontinued a lot of his medicines including metformin and Lipitor.  Labs are as above.  Had repeat visit recently 9/21 patient was just told to follow-up with [place].  Currently patient tells me he is going 2 to 3 days in between a bowel movement and is using Colace on a daily basis.  This is very abnormal for him.  Associated symptoms include a 40 pound weight loss over the past 2 months.  Patient tells me he has altered his diet to "more healthy".    Denies fever, chills or symptoms that awaken him from sleep.  Past Medical History:  Diagnosis Date   Arthritis    Coronary artery disease    Diabetes ([place])    History of kidney stones    Hypertension     Past Surgical History:  Procedure Laterality Date   CHOLECYSTECTOMY     CORONARY STENT PLACEMENT  2012   KNEE ARTHROSCOPY Bilateral 1999   thyroid biopsy  05/24/2019   benign    Current Outpatient Medications  Medication Sig Dispense Refill   atenolol (TENORMIN) 100 MG tablet Take 100 mg by mouth daily.     clopidogrel (PLAVIX) 75 MG tablet Take 75 mg by mouth daily.     furosemide (LASIX) 20 MG  tablet Take 20 mg by mouth every morning.     insulin detemir (LEVEMIR) 100 UNIT/ML injection Inject 50-100 Units into the skin daily.     lisinopril (PRINIVIL,ZESTRIL) 2.5 MG tablet Take 2.5 mg by mouth daily.     Oxycodone HCl 10 MG TABS Take 1 tablet by mouth 3 (three) times daily.     Vitamin D, Ergocalciferol, (DRISDOL) 50000 units CAPS capsule Take 50,000 Units by mouth every 7 (seven) days.      No current facility-administered medications for this visit.     Allergies as of 06/19/2019 - Review Complete 06/19/2019  Allergen Reaction Noted   Penicillins Rash 04/20/2018    Family History  Problem Relation Age of Onset   Colon polyps Mother    Colon cancer Mother        age  72   Kidney  cancer Father    Heart disease Father        Dads brothers had heart disease   Liver cancer Neg Hx    Rectal cancer Neg Hx    Esophageal cancer Neg Hx    Stomach cancer Neg Hx     Social History   Socioeconomic History   Marital status: Married    Spouse name: Clinical cytogeneticist   Number of children: 3   Years of education: Not on file   Highest education level: Not on file  Occupational History   Occupation: Network engineer for [employer]   Social Needs   Financial resource strain: Not on file   Food insecurity    Worry: Not on file    Inability: Not on file   Transportation needs    Medical: Not on file    Non-medical: Not on file  Tobacco Use   Smoking status: Former Smoker   Smokeless tobacco: Never Used  Substance and Sexual Activity   Alcohol use: Not Currently    Comment: social 5 drinks yearly   Drug use: Never   Sexual activity: Not on file  Lifestyle   Physical activity    Days per week: Not on file    Minutes per session: Not on file   Stress: Not on file  Relationships   Social connections    Talks on phone: Not on file    Gets together: Not on file    Attends religious service: Not on file    Active member of club or organization: Not on file    Attends meetings of clubs or organizations: Not on file    Relationship status: Not on file   Intimate partner violence    Fear of current or ex partner: Not on file    Emotionally abused: Not on file    Physically abused: Not on file    Forced sexual activity: Not on file  Other Topics Concern   Not on file  Social History Narrative   Not on file    Review of Systems:    Constitutional: No fever or chills Skin: +Jaundice Cardiovascular: No chest pain Respiratory: No SOB  Gastrointestinal: See HPI and otherwise negative Genitourinary: No dysuria  Neurological: No headache, dizziness or syncope Musculoskeletal: No new muscle or joint pain Hematologic: No bleeding or  bruising Psychiatric: No history of depression or anxiety   Physical Exam:  Vital signs: BP 100/60    Pulse 78    Temp 98.6 F (37 C)    Ht _0  (1.88 m)    Wt 205 lb (93 kg)    BMI 26.32  kg/m   Constitutional:   Pleasant Jaundiced Caucasian male appears to be in NAD, Well developed, Well nourished, alert and cooperative Head:  Normocephalic and atraumatic. Eyes:   PEERL, EOMI. Icteric  Conjunctiva pink. Ears:  Normal auditory acuity. Neck:  Supple Throat: Oral cavity and pharynx without inflammation, swelling or lesion.  Respiratory: Respirations even and unlabored. Lungs clear to auscultation bilaterally.   No wheezes, crackles, or rhonchi.  Cardiovascular: Normal S1, S2. No MRG. Regular rate and rhythm. No peripheral edema, cyanosis or pallor.  Gastrointestinal:  Soft, mild distension, moderate ttp in RUQ and epigastrum, with involuntary guarding, Normal bowel sounds. No appreciable masses or hepatomegaly. Rectal:  Not performed.  Msk:  Symmetrical without gross deformities. Without edema, no deformity or joint abnormality.  Neurologic:  Alert and  oriented x4;  grossly normal neurologically.  Skin:   Jaundiced Dry and intact without significant lesions or rashes. Psychiatric: Demonstrates good judgement and reason without abnormal affect or behaviors.  See HPI for most recent labs.  Assessment: 1.  Jaundice: 2 episodes over the past month, the first seemed to dissipate after 4 to 5 days, this has been going on for 9 days at this point, elevated LFTs in August, rechecking labs today and ordering imaging also ordered hepatitis labs 2.  Abdominal pain: Mostly in the epigastrium and right upper quadrant, status post cholecystectomy 3 years ago, concern for choledocholithiasis given pain and nausea and vomiting with jaundice 3.  History of adenomatous polyps: Last: 05/03/2018 repeat recommended in 6 months 4.  Chronic anticoagulation: On Plavix for CAD  Plan: 1.  Ordered CBC, CMP and  hepatitis labs 2.  Ordered stat CT abdomen pelvis with contrast 3.  Discussed with patient that pending results above we will decide what to do next. 4.  Patient is in need of recall colonoscopy, in fact this is overdue, but at the moment more pressing is his jaundice.  This will need to be scheduled shortly though.  We will go ahead and send a letter to his PCP as patient will need to hold his Plavix for 5 days prior to time of his procedure.  We will communicate with his physician to ensure that this is acceptable for him. 5.  Colonoscopy will need to be scheduled with [facility]. Loletha [facility] in [facility].  Will await labs above and imaging for further recommendations.  Will have close follow-up with the patient. 6.  Patient was given ER precautions including worsening of jaundice, recurrent of abdominal pain, nausea or vomiting  Ellouise Newer, PA-C [facility] 06/19/2019, 10:03 AM  Cc: Antonietta Jewel, MD   Addendum: 3:30 PM patient's wife presented in office telling us that she just took her husband to the ER but is not sure why.  I discussed patient's recent labs with her and explained that we feel as though he has a high-grade obstruction and will likely need ERCP.  He would likely be admitted to the hospital and our team over there we will see him.  Answered all of her questions.  I am happy that she took him to the ER.  She did provide me with another phone number in case we need to reach someone urgently again.  Number is [phone]  Ellouise Newer, PA-C

## 2019-06-19 NOTE — Patient Instructions (Addendum)
If you are age 61 or younger, your body mass index should be between 19-25. Your Body mass index is 26.32 kg/m. If this is out of the aformentioned range listed, please consider follow up with your Primary Care Provider.    Your provider has requested that you go to the basement level for lab work before leaving today. Press "B" on the elevator. The lab is located at the first door on the left as you exit the elevator.   You have been scheduled for a CT scan of the abdomen and pelvis at [facility] are scheduled on 06/19/19 at 3:30pm. You should arrive 15 minutes prior to your appointment time for registration. Please follow the written instructions below on the day of your exam:  WARNING: IF YOU ARE ALLERGIC TO IODINE/X-RAY DYE, PLEASE NOTIFY RADIOLOGY IMMEDIATELY AT [phone]! YOU WILL BE GIVEN A 13 HOUR PREMEDICATION PREP.  1) Do not eat or drink anything after 11:30am (4 hours prior to your test) 2) You have been given 2 bottles of oral contrast to drink. The solution may taste better if refrigerated, but do NOT add ice or any other liquid to this solution. Shake well before drinking.    Drink 1 bottle of contrast @ 1:30pm (2 hours prior to your exam)  Drink 1 bottle of contrast @ 2:30pm  (1 hour prior to your exam)  You may take any medications as prescribed with a small amount of water, if necessary. If you take any of the following medications: METFORMIN, GLUCOPHAGE, GLUCOVANCE, AVANDAMET, RIOMET, FORTAMET, [place] MET, JANUMET, GLUMETZA or METAGLIP, you MAY be asked to HOLD this medication 48 hours AFTER the exam.  The purpose of you drinking the oral contrast is to aid in the visualization of your intestinal tract. The contrast solution may cause some diarrhea. Depending on your individual set of symptoms, you may also receive an intravenous injection of x-ray contrast/dye. P This test typically takes 30-45 minutes to complete.  If you have any questions regarding your exam  or if you need to reschedule, you may call the CT department at [phone] between the hours of 8:00 am and 5:00 pm, Monday-Friday.  ________________________________________________________________________  Thank you for choosing me and [facility].  Dennison Bulla

## 2019-06-19 NOTE — ED Provider Notes (Signed)
[facility] DEPT Provider Note   CSN: [id number] Arrival date & time: 06/19/19  1502     History   Chief Complaint Chief Complaint  Patient presents with  . Abdominal Pain  . Emesis    HPI Ricky Cabrera is a 63 y.o. male.     HPI   Patient was sent here by gastroenterology after visit today, when labs were done that showed significant elevated bilirubin, raising concern for "high-grade obstruction."  It was felt that he may need "an ERCP."  He has had intermittent episodes of jaundice associated with abdominal pain and vomiting for several months.  Initially evaluated by his PCP and referred to gastroenterology.  He has had decreased stooling recently and is taking stool softeners.  He has not had any change in color stool and has not seen any blood in stool, or emesis.  He had fever with an episode of jaundice, several months ago up to 101 but only low-grade fevers since then.  The episodes of jaundice tend to last for 5 days.  No known sick contacts or abnormal food ingestions.  A CT scan was ordered for today, was not done because he was sent here for evaluation.  Past Medical History:  Diagnosis Date  . Arthritis   . Coronary artery disease   . Diabetes ([place])   . History of kidney stones   . Hypertension   . Renal disorder     There are no active problems to display for this patient.   Past Surgical History:  Procedure Laterality Date  . CHOLECYSTECTOMY    . CORONARY STENT PLACEMENT  2012  . KNEE ARTHROSCOPY Bilateral 1999  . thyroid biopsy  05/24/2019   benign        Home Medications    Prior to Admission medications   Medication Sig Start Date End Date Taking? Authorizing Provider  atenolol (TENORMIN) 100 MG tablet Take 100 mg by mouth daily.   Yes [provider]  clopidogrel (PLAVIX) 75 MG tablet Take 75 mg by mouth daily.   Yes [provider]  docusate sodium (COLACE) 50 MG capsule Take 100 mg by mouth 3  (three) times daily.   Yes [provider]  furosemide (LASIX) 20 MG tablet Take 20 mg by mouth every morning. 02/08/18  Yes [provider]  insulin detemir (LEVEMIR) 100 UNIT/ML injection Inject 50-100 Units into the skin daily.   Yes [provider]  lisinopril (PRINIVIL,ZESTRIL) 2.5 MG tablet Take 2.5 mg by mouth daily.   Yes [provider]  Oxycodone HCl 10 MG TABS Take 1 tablet by mouth 3 (three) times daily. 06/10/19  Yes [provider]  Vitamin D, Ergocalciferol, (DRISDOL) 50000 units CAPS capsule Take 50,000 Units by mouth every 7 (seven) days.  04/06/18  Yes [provider]  sildenafil (VIAGRA) 100 MG tablet Take 100 mg by mouth every other day. 06/10/19   [provider]    Family History Family History  Problem Relation Age of Onset  . Colon polyps Mother   . Colon cancer Mother        age  68  . Kidney cancer Father   . Heart disease Father        Dads brothers had heart disease  . Liver cancer Neg Hx   . Rectal cancer Neg Hx   . Esophageal cancer Neg Hx   . Stomach cancer Neg Hx     Social History Social History   Tobacco  Use  . Smoking status: Former Smoker    Types: Cigarettes  . Smokeless tobacco: Never Used  Substance Use Topics  . Alcohol use: Not Currently    Comment: social 5 drinks yearly  . Drug use: Never     Allergies   Penicillins   Review of Systems Review of Systems  All other systems reviewed and are negative.    Physical Exam Updated Vital Signs BP (!) 141/85   Pulse 78   Temp 98.8 F (37.1 C) (Oral)   Resp 16   Ht 6\' 2"  (1.88 m)   Wt 93 kg   SpO2 98%   BMI 26.32 kg/m   Physical Exam Vitals signs and nursing note reviewed.  Constitutional:      Appearance: He is well-developed.  HENT:     Head: Normocephalic and atraumatic.     Right Ear: External ear normal.     Left Ear: External ear normal.  Eyes:     Conjunctiva/sclera: Conjunctivae normal.     Pupils:  Pupils are equal, round, and reactive to light.  Neck:     Musculoskeletal: Normal range of motion and neck supple.     Trachea: Phonation normal.  Cardiovascular:     Rate and Rhythm: Normal rate and regular rhythm.     Heart sounds: Normal heart sounds.  Pulmonary:     Effort: Pulmonary effort is normal.     Breath sounds: Normal breath sounds.  Abdominal:     General: There is no distension.     Palpations: Abdomen is soft. There is mass (Mass midline chest to the right, epigastric region, likely focal hepatomegaly.).     Tenderness: There is no abdominal tenderness.     Hernia: No hernia is present.  Musculoskeletal: Normal range of motion.  Skin:    General: Skin is warm and dry.     Coloration: Skin is jaundiced.     Findings: No bruising.  Neurological:     Mental Status: He is alert and oriented to person, place, and time.     Cranial Nerves: No cranial nerve deficit.     Sensory: No sensory deficit.     Motor: No abnormal muscle tone.     Coordination: Coordination normal.  Psychiatric:        Mood and Affect: Mood normal.        Behavior: Behavior normal.        Thought Content: Thought content normal.        Judgment: Judgment normal.      ED Treatments / Results  Labs (all labs ordered are listed, but only abnormal results are displayed) Labs Reviewed  COMPREHENSIVE METABOLIC PANEL - Abnormal; Notable for the following components:      Result Value   Sodium 132 (*)    Potassium 5.3 (*)    Glucose, Bld 163 (*)    AST 96 (*)    ALT 178 (*)    Alkaline Phosphatase 319 (*)    Total Bilirubin 9.4 (*)    All other components within normal limits  CBC - Abnormal; Notable for the following components:   WBC 11.8 (*)    Hemoglobin 17.6 (*)    HCT 53.1 (*)    All other components within normal limits  LIPASE, BLOOD  URINALYSIS, ROUTINE W REFLEX MICROSCOPIC    EKG None  Radiology No results found.  Procedures Procedures (including critical care time)   Medications Ordered in ED Medications - No data to display  Initial Impression / Assessment and Plan / ED Course  I have reviewed the triage vital signs and the nursing notes.  Pertinent labs & imaging results that were available during my care of the patient were reviewed by me and considered in my medical decision making (see chart for details).  Clinical Course as of Jun 19 2123  Wed Jun 19, 2019  2107 Case discussed with gastroenterology, Dr. Tarri Glenn, who recommends proceeding with CT imaging then hospitalist admission.   [EW]  2122 Normal except sodium low, potassium high, glucose high, AST high, ALT high, alkaline phosphatase high, total bilirubin high  Comprehensive metabolic panel(!) [EW]  [id number] Normal  Lipase, blood [EW]  2122 Normal except white count high, hemoglobin high   [EW]    Clinical Course User Index [EW] Daleen Bo, MD        Patient Vitals for the past 24 hrs:  BP Temp Temp src Pulse Resp SpO2 Height Weight  06/19/19 2100 (!) 141/85 - - 78 16 98 % - -  06/19/19 2026 139/76 - - 74 16 99 % - -  06/19/19 2025 139/76 - - - - - - -  06/19/19 1546 - - - - - - 6\' 2"  (1.88 m) 93 kg  06/19/19 1543 129/81 98.8 F (37.1 C) Oral 74 16 99 % - -    9:23 PM Reevaluation with update and discussion. After initial assessment and treatment, an updated evaluation reveals no change in clinical status, findings cussed with the patient and his wife all questions were answered. Daleen Bo   Medical Decision Making: Hyperbilirubinemia, with apparent intermittent jaundice and abdominal pain with vomiting.  Currently his abdomen is benign, but there is a small mass palpated in the midline of the upper abdomen.  Gastroenterology requested the patient be admitted they will follow and likely obtain ERCP, during admission.  Patient is nontoxic but may be mildly hemoconcentrated explaining elevated hemoglobin white count and potassium.  Nonspecific mild transaminitis.   CRITICAL CARE-no Performed by: Daleen Bo  Nursing Notes Reviewed/ Care Coordinated Applicable Imaging Reviewed Interpretation of Laboratory Data incorporated into ED treatment  9:24 PM-Consult complete with hospitalist. Patient case explained and discussed.  He agrees to admit patient for further evaluation and treatment. Call ended at 9:32 PM  Plan: [place].  Consultation GI.   Final Clinical Impressions(s) / ED Diagnoses   Final diagnoses:  Hyperbilirubinemia  Transaminitis    ED Discharge Orders    None       Daleen Bo, MD 06/19/19 2132

## 2019-06-20 ENCOUNTER — Encounter (HOSPITAL_COMMUNITY): Payer: Self-pay

## 2019-06-20 DIAGNOSIS — Z880 Allergy status to penicillin: Secondary | ICD-10-CM | POA: Diagnosis not present

## 2019-06-20 DIAGNOSIS — K8309 Other cholangitis: Secondary | ICD-10-CM | POA: Diagnosis not present

## 2019-06-20 DIAGNOSIS — Z8051 Family history of malignant neoplasm of kidney: Secondary | ICD-10-CM | POA: Diagnosis not present

## 2019-06-20 DIAGNOSIS — Z800 Family history of malignant neoplasm of digestive organs: Secondary | ICD-10-CM | POA: Diagnosis not present

## 2019-06-20 DIAGNOSIS — Z955 Presence of coronary angioplasty implant and graft: Secondary | ICD-10-CM | POA: Diagnosis not present

## 2019-06-20 DIAGNOSIS — K8050 Calculus of bile duct without cholangitis or cholecystitis without obstruction: Secondary | ICD-10-CM

## 2019-06-20 DIAGNOSIS — Z79891 Long term (current) use of opiate analgesic: Secondary | ICD-10-CM | POA: Diagnosis not present

## 2019-06-20 DIAGNOSIS — E871 Hypo-osmolality and hyponatremia: Secondary | ICD-10-CM | POA: Diagnosis present

## 2019-06-20 DIAGNOSIS — Z9049 Acquired absence of other specified parts of digestive tract: Secondary | ICD-10-CM | POA: Diagnosis not present

## 2019-06-20 DIAGNOSIS — Z8249 Family history of ischemic heart disease and other diseases of the circulatory system: Secondary | ICD-10-CM | POA: Diagnosis not present

## 2019-06-20 DIAGNOSIS — D751 Secondary polycythemia: Secondary | ICD-10-CM | POA: Diagnosis present

## 2019-06-20 DIAGNOSIS — E875 Hyperkalemia: Secondary | ICD-10-CM | POA: Diagnosis present

## 2019-06-20 DIAGNOSIS — Z87891 Personal history of nicotine dependence: Secondary | ICD-10-CM | POA: Diagnosis not present

## 2019-06-20 DIAGNOSIS — Z8371 Family history of colonic polyps: Secondary | ICD-10-CM | POA: Diagnosis not present

## 2019-06-20 DIAGNOSIS — K8030 Calculus of bile duct with cholangitis, unspecified, without obstruction: Secondary | ICD-10-CM | POA: Diagnosis present

## 2019-06-20 DIAGNOSIS — I2510 Atherosclerotic heart disease of native coronary artery without angina pectoris: Secondary | ICD-10-CM | POA: Diagnosis present

## 2019-06-20 DIAGNOSIS — Z87442 Personal history of urinary calculi: Secondary | ICD-10-CM | POA: Diagnosis not present

## 2019-06-20 DIAGNOSIS — Z794 Long term (current) use of insulin: Secondary | ICD-10-CM | POA: Diagnosis not present

## 2019-06-20 DIAGNOSIS — G8929 Other chronic pain: Secondary | ICD-10-CM | POA: Diagnosis present

## 2019-06-20 DIAGNOSIS — Z20828 Contact with and (suspected) exposure to other viral communicable diseases: Secondary | ICD-10-CM | POA: Diagnosis present

## 2019-06-20 DIAGNOSIS — I152 Hypertension secondary to endocrine disorders: Secondary | ICD-10-CM | POA: Diagnosis present

## 2019-06-20 DIAGNOSIS — Z7902 Long term (current) use of antithrombotics/antiplatelets: Secondary | ICD-10-CM | POA: Diagnosis not present

## 2019-06-20 DIAGNOSIS — K5900 Constipation, unspecified: Secondary | ICD-10-CM | POA: Diagnosis present

## 2019-06-20 DIAGNOSIS — E1169 Type 2 diabetes mellitus with other specified complication: Secondary | ICD-10-CM | POA: Diagnosis present

## 2019-06-20 HISTORY — DX: Secondary polycythemia: D75.1

## 2019-06-20 HISTORY — DX: Other cholangitis: K83.09

## 2019-06-20 HISTORY — DX: Calculus of bile duct without cholangitis or cholecystitis without obstruction: K80.50

## 2019-06-20 LAB — URINALYSIS, ROUTINE W REFLEX MICROSCOPIC
Bilirubin Urine: NEGATIVE
Glucose, UA: NEGATIVE mg/dL
Hgb urine dipstick: NEGATIVE
Ketones, ur: NEGATIVE mg/dL
Leukocytes,Ua: NEGATIVE
Nitrite: NEGATIVE
Protein, ur: NEGATIVE mg/dL
Specific Gravity, Urine: 1.009 (ref 1.005–1.030)
pH: 5 (ref 5.0–8.0)

## 2019-06-20 LAB — HEPATITIS B SURFACE ANTIBODY,QUALITATIVE: Hep B S Ab: NONREACTIVE

## 2019-06-20 LAB — GLUCOSE, CAPILLARY
Glucose-Capillary: 216 mg/dL — ABNORMAL HIGH (ref 70–99)
Glucose-Capillary: 226 mg/dL — ABNORMAL HIGH (ref 70–99)
Glucose-Capillary: 192 mg/dL — ABNORMAL HIGH (ref 70–99)
Glucose-Capillary: 247 mg/dL — ABNORMAL HIGH (ref 70–99)

## 2019-06-20 LAB — COMPREHENSIVE METABOLIC PANEL
ALT: 163 U/L — ABNORMAL HIGH (ref 0–44)
AST: 99 U/L — ABNORMAL HIGH (ref 15–41)
Albumin: 3.1 g/dL — ABNORMAL LOW (ref 3.5–5.0)
Alkaline Phosphatase: 304 U/L — ABNORMAL HIGH (ref 38–126)
Anion gap: 8 (ref 5–15)
BUN: 19 mg/dL (ref 8–23)
CO2: 27 mmol/L (ref 22–32)
Calcium: 8.7 mg/dL — ABNORMAL LOW (ref 8.9–10.3)
Chloride: 98 mmol/L (ref 98–111)
Creatinine, Ser: 0.94 mg/dL (ref 0.61–1.24)
GFR calc Af Amer: >60 mL/min (ref >60)
GFR calc non Af Amer: >60 mL/min (ref >60)
Glucose, Bld: 213 mg/dL — ABNORMAL HIGH (ref 70–99)
Potassium: 4.4 mmol/L (ref 3.5–5.1)
Sodium: 133 mmol/L — ABNORMAL LOW (ref 135–145)
Total Bilirubin: 10.4 mg/dL — ABNORMAL HIGH (ref 0.3–1.2)
Total Protein: 6.5 g/dL (ref 6.5–8.1)

## 2019-06-20 LAB — HEPATITIS C ANTIBODY
Hepatitis C Ab: NONREACTIVE
SIGNAL TO CUT-OFF: 0.02 (ref <1.00)

## 2019-06-20 LAB — HEPATITIS B SURFACE ANTIGEN: Hepatitis B Surface Ag: NONREACTIVE

## 2019-06-20 LAB — HIV ANTIBODY (ROUTINE TESTING W REFLEX): HIV Screen 4th Generation wRfx: NONREACTIVE

## 2019-06-20 LAB — SARS CORONAVIRUS 2 (TAT 6-24 HRS): SARS Coronavirus 2: NEGATIVE

## 2019-06-20 LAB — HEMOGLOBIN A1C
Hgb A1c MFr Bld: 7.3 % — ABNORMAL HIGH (ref 4.8–5.6)
Mean Plasma Glucose: 162.81 mg/dL

## 2019-06-20 LAB — HEPATITIS A ANTIBODY, TOTAL: Hepatitis A AB,Total: NONREACTIVE

## 2019-06-20 NOTE — Progress Notes (Signed)
PROGRESS NOTE    Ricky Cabrera  HFW:[id number] DOB: 1955-10-08 DOA: 06/19/2019 PCP: Antonietta Jewel, MD  Brief Narrative:63 y.o. male with medical history significant for CAD s/p DES to LAD 11/2013, IDT2DM, hypertension, history of choledocholithiasis s/p laparoscopic cholecystectomy 01/18/2017 who presents to the ED for evaluation of intermittent jaundice and abnormal liver function testing.  Patient reports several months of intermittent central abdominal pain associated with episodes of nausea and vomiting as well as jaundice.  He has had recently worsening symptoms over the last week or 2.  He was seen by his [place] GI provider on day of admission and was found to have elevated LFTs.  He was advised to present to the ED for further evaluation due to concern for extrahepatic biliary obstruction from choledocholithiasis.  Patient otherwise also reports constipation with last bowel movement morning of admission.  He denies any diarrhea.  He denies any fevers, chills, chest pain, dyspnea, or dysuria.  He states he has been taken off of his metformin and atorvastatin by his PCP due to concern for effect on the liver.  ED Course:  Initial vitals showed BP 141/85, pulse 78, RR 16, temp 98.8 Fahrenheit, SPO2 98% on room air.  Labs notable for AST 96, ALT 178, alk phos 319, total bilirubin 9.4, BUN 23, creatinine 0.96, potassium 5.3, serum glucose 163, WBC 11.8, hemoglobin 17.6, platelets 185,000, lipase 28.  EDP discussed the case with on-call GI who recommended CT abdomen/pelvis with contrast for further evaluation and medical admission for potential ERCP tomorrow.  The hospitalist service was consulted to admit for further evaluation and management.   Assessment & Plan:   Principal Problem:   Choledocholithiasis Active Problems:   Transaminitis   CAD (coronary artery disease)   Diabetes mellitus ([place])   Hypertension associated with diabetes ([place])   Cholangitis   Erythrocytosis #1  cholangitis with choledocholithiasis patient presented with transaminitis and intermittent jaundice nausea vomiting and abdominal pain.  He has history of gallstones status post laparoscopic cholecystectomy in May 2018.  Patient reports he is better though still with right upper quadrant pain and discomfort.  AST ALT trending down.  Total bilirubin 10.4 down from 11.  Follow-up labs tomorrow.  CT of the abdomen shows choledocholithiasis with dilatation of the biliary tree and findings concerning for superimposed cholangitis.  No bowel obstruction.  GI planning on doing ERCP and sphincterotomy with CBD stone removal possibly by the weekend as patient was on Plavix and they are waiting for the Plavix effect to wear off.  Follow-up P2 Y 12 test.  N.p.o. in a.m.  #2 history of CAD status post stent March 2015.  Was on statin which was stopped by his PCP due to transaminitis.  Continue to hold Plavix.  #3 type 2 diabetes patient takes Levemir at home continue SSI  #4 hypertension continue lisinopril and atenolol    Estimated body mass index is 26.35 kg/m as calculated from the following:   Height as of this encounter: '6\' 2"'  (1.88 m).   Weight as of this encounter: 93.1 kg.  DVT prophylaxis: SCD as patient waiting for surgery ERCP and sphincterotomy Code Status: Full code Family Communication: Discussed with patient Disposition Plan: Pending ERCP and sphincterotomy   Consultants:   GI Dr. Carlean Purl  Procedures: None Antimicrobials:  Subjective: Resting in bed complains of right sided abdominal discomfort and pain no nausea vomiting  Objective: Vitals:   06/20/19 0253 06/20/19 0433 06/20/19 0500 06/20/19 1344  BP: 122/74 140/77  137/76  Pulse: 80  81  65  Resp: '18 18  18  ' Temp: 98.1 F (36.7 C) 98.4 F (36.9 C)  98.1 F (36.7 C)  TempSrc: Oral Oral  Oral  SpO2: 95% 97%  93%  Weight:   93.1 kg   Height:   '6\' 2"'  (1.88 m)     Intake/Output Summary (Last 24 hours) at 06/20/2019  1446 Last data filed at 06/20/2019 1424 Gross per 24 hour  Intake 1833.87 ml  Output 500 ml  Net 1333.87 ml   Filed Weights   06/19/19 1546 06/20/19 0500  Weight: 93 kg 93.1 kg    Examination: Jaundice noted  General exam: Appears calm and comfortable  Respiratory system: Clear to auscultation. Respiratory effort normal. Cardiovascular system: S1 & S2 heard, RRR. No JVD, murmurs, rubs, gallops or clicks. No pedal edema. Gastrointestinal system: Abdomen is nondistended, soft and right upper quadrant tender. No organomegaly or masses felt. Normal bowel sounds heard. Central nervous system: Alert and oriented. No focal neurological deficits. Extremities: Symmetric 5 x 5 power. Skin: No rashes, lesions or ulcers Psychiatry: Judgement and insight appear normal. Mood & affect appropriate.     Data Reviewed: I have personally reviewed following labs and imaging studies  CBC: Recent Labs  Lab 06/19/19 1024 06/19/19 1704  WBC 12.6* 11.8*  NEUTROABS 9.5*  --   HGB 17.8* 17.6*  HCT 53.0* 53.1*  MCV 92.1 92.8  PLT 188.0 175   Basic Metabolic Panel: Recent Labs  Lab 06/19/19 1024 06/19/19 1704 06/20/19 0350  NA 133* 132* 133*  K 4.5 5.3* 4.4  CL 97 98 98  CO2 '26 22 27  ' GLUCOSE 221* 163* 213*  BUN '21 23 19  ' CREATININE 1.13 0.96 0.94  CALCIUM 10.1 9.4 8.7*   GFR: Estimated Creatinine Clearance: 95.9 mL/min (by C-G formula based on SCr of 0.94 mg/dL). Liver Function Tests: Recent Labs  Lab 06/19/19 1024 06/19/19 1704 06/20/19 0350  AST 102* 96* 99*  ALT 180* 178* 163*  ALKPHOS 376* 319* 304*  BILITOT 11.1* 9.4* 10.4*  PROT 7.6 7.5 6.5  ALBUMIN 4.2 3.7 3.1*   Recent Labs  Lab 06/19/19 1704  LIPASE 28   No results for input(s): AMMONIA in the last 168 hours. Coagulation Profile: No results for input(s): INR, PROTIME in the last 168 hours. Cardiac Enzymes: No results for input(s): CKTOTAL, CKMB, CKMBINDEX, TROPONINI in the last 168 hours. BNP (last 3  results) No results for input(s): PROBNP in the last 8760 hours. HbA1C: Recent Labs    06/19/19 1703  HGBA1C 7.3*   CBG: Recent Labs  Lab 06/20/19 0724 06/20/19 1125  GLUCAP 216* 226*   Lipid Profile: No results for input(s): CHOL, HDL, LDLCALC, TRIG, CHOLHDL, LDLDIRECT in the last 72 hours. Thyroid Function Tests: No results for input(s): TSH, T4TOTAL, FREET4, T3FREE, THYROIDAB in the last 72 hours. Anemia Panel: No results for input(s): VITAMINB12, FOLATE, FERRITIN, TIBC, IRON, RETICCTPCT in the last 72 hours. Sepsis Labs: No results for input(s): PROCALCITON, LATICACIDVEN in the last 168 hours.  No results found for this or any previous visit (from the past 240 hour(s)).       Radiology Studies: Ct Abdomen Pelvis W Contrast  Result Date: 06/19/2019 CLINICAL DATA:  63 year old male with known cholangitis presenting for follow-up. EXAM: CT ABDOMEN AND PELVIS WITH CONTRAST TECHNIQUE: Multidetector CT imaging of the abdomen and pelvis was performed using the standard protocol following bolus administration of intravenous contrast. CONTRAST:  170m OMNIPAQUE IOHEXOL 300 MG/ML  SOLN COMPARISON:  Abdominal  ultrasound dated 01/12/2017 FINDINGS: Lower chest: The visualized lung bases are clear. Advanced multi vessel coronary vascular calcification noted. Intra-abdominal free air or free fluid. Hepatobiliary: Apparent fatty infiltration of the liver. There is dilatation of the intrahepatic biliary tree as well as common bile duct. Several noncalcified adjacent stones noted in the central CBD with a combined length of 18 mm (coronal series 5 image 76). The common bile duct measures approximately 11 mm in diameter. Mild haziness of the fat surrounding the biliary tree concerning for superimposed infection/cholangitis. Further evaluation with MRCP is recommended. Pancreas: Unremarkable. No pancreatic ductal dilatation or surrounding inflammatory changes. Spleen: Normal in size without focal  abnormality. Adrenals/Urinary Tract: The adrenal glands are unremarkable. There is no hydronephrosis on either side. There is symmetric enhancement and excretion of contrast by both kidneys. Several hypodense lesion in the left kidney measure up to 1.5 cm in the inferior pole. The larger lesions demonstrate fluid attenuation most consistent with cysts and the smaller lesions are too small to characterize. Ultrasound may provide better evaluation on a nonemergent basis. The visualized ureters and urinary bladder appear unremarkable. Stomach/Bowel: There is no bowel obstruction or active inflammation. The appendix is normal. Vascular/Lymphatic: Moderate aortoiliac atherosclerotic disease. The IVC is grossly unremarkable. No portal venous gas. There is no adenopathy. Reproductive: The prostate and seminal vesicles are grossly unremarkable. No pelvic mass Other: Diffuse skin thickening and stranding of the subcutaneous soft tissues of the anterior abdominal wall. No fluid collection. Musculoskeletal: Degenerative changes of the spine. No acute osseous pathology. IMPRESSION: 1. Choledocholithiasis with dilatation of the biliary tree and findings concerning for superimposed cholangitis. Further evaluation with MRCP is recommended. 2. No bowel obstruction or active inflammation. Normal appendix. Aortic Atherosclerosis (ICD10-I70.0). Electronically Signed   By: Anner Crete M.D.   On: 06/19/2019 22:55        Scheduled Meds:  atenolol  100 mg Oral Daily   insulin aspart  0-15 Units Subcutaneous TID WC   lisinopril  2.5 mg Oral Daily   Continuous Infusions:  ciprofloxacin 400 mg (06/20/19 0911)   metronidazole 500 mg (06/20/19 0807)     LOS: 0 days     Georgette Shell, MD [facility]  If 7PM-7AM, please contact night-coverage [email or web address] Password [email or web address] 06/20/2019, 2:46 PM

## 2019-06-20 NOTE — Progress Notes (Signed)
Patient Name: Ricky Cabrera Date of Encounter: 06/20/2019, 9:47 AM    Subjective  Choledocholithiasis/cholangitis RUQ pain - better hungry   Objective  BP 140/77 (BP Location: Left Arm)   Pulse 81   Temp 98.4 F (36.9 C) (Oral)   Resp 18   Ht 6\' 2"  (1.88 m)   Wt 93.1 kg   SpO2 97%   BMI 26.35 kg/m  Jaundiced NAD Lungs cta Cor s1s2 no rmg abd soft mildly tender RUQ Alert and oriented   Lab Results  Component Value Date   ALT 163 (H) 06/20/2019   AST 99 (H) 06/20/2019   ALKPHOS 304 (H) 06/20/2019   BILITOT 10.4 (H) 06/20/2019   Lab Results  Component Value Date   WBC 11.8 (H) 06/19/2019   HGB 17.6 (H) 06/19/2019   HCT 53.1 (H) 06/19/2019   MCV 92.8 06/19/2019   PLT 185 06/19/2019   CT Abdomen Pelvis W Contrast CLINICAL DATA:  63 year old male with known cholangitis presenting for follow-up.  EXAM: CT ABDOMEN AND PELVIS WITH CONTRAST  TECHNIQUE: Multidetector CT imaging of the abdomen and pelvis was performed using the standard protocol following bolus administration of intravenous contrast.  CONTRAST:  134mL OMNIPAQUE IOHEXOL 300 MG/ML  SOLN  COMPARISON:  Abdominal ultrasound dated 01/12/2017  FINDINGS: Lower chest: The visualized lung bases are clear. Advanced multi vessel coronary vascular calcification noted.  Intra-abdominal free air or free fluid.  Hepatobiliary: Apparent fatty infiltration of the liver. There is dilatation of the intrahepatic biliary tree as well as common bile duct. Several noncalcified adjacent stones noted in the central CBD with a combined length of 18 mm (coronal series 5 image 76). The common bile duct measures approximately 11 mm in diameter. Mild haziness of the fat surrounding the biliary tree concerning for superimposed infection/cholangitis. Further evaluation with MRCP is recommended.  Pancreas: Unremarkable. No pancreatic ductal dilatation or surrounding inflammatory changes.  Spleen: Normal in size  without focal abnormality.  Adrenals/Urinary Tract: The adrenal glands are unremarkable. There is no hydronephrosis on either side. There is symmetric enhancement and excretion of contrast by both kidneys. Several hypodense lesion in the left kidney measure up to 1.5 cm in the inferior pole. The larger lesions demonstrate fluid attenuation most consistent with cysts and the smaller lesions are too small to characterize. Ultrasound may provide better evaluation on a nonemergent basis. The visualized ureters and urinary bladder appear unremarkable.  Stomach/Bowel: There is no bowel obstruction or active inflammation. The appendix is normal.  Vascular/Lymphatic: Moderate aortoiliac atherosclerotic disease. The IVC is grossly unremarkable. No portal venous gas. There is no adenopathy.  Reproductive: The prostate and seminal vesicles are grossly unremarkable. No pelvic mass  Other: Diffuse skin thickening and stranding of the subcutaneous soft tissues of the anterior abdominal wall. No fluid collection.  Musculoskeletal: Degenerative changes of the spine. No acute osseous pathology.  IMPRESSION: 1. Choledocholithiasis with dilatation of the biliary tree and findings concerning for superimposed cholangitis. Further evaluation with MRCP is recommended. 2. No bowel obstruction or active inflammation. Normal appendix.  Aortic Atherosclerosis (ICD10-I70.0).  Electronically Signed   By: Anner Crete M.D.   On: 06/19/2019 22:55     Assessment and Plan    Choledocholithiasis and cholangitis - appears improved on Abx  CAD on colpidogrel held as of yesterday but believe had yesterday  Erythrocytosis - chronic off/on ? Cause    Needs ERCP and sphincterotomy w/ CBD stone removal but best if colpidogrel effects gone  If needs urgent drainage could  place stent but he is responding to abx  So will wait for clopidogrel to wear off - I will check P2Y12 test in Am which will tell  me if clopidogrel is actually affecting PLT's and hhow much to guide timing  Feed today NPO in AM  May be Sunday at earliest before I can do ERCP if clopidogrel  but will see how the P2y12 tests go  Gatha Mayer, MD, [facility] 06/20/2019 9:47 AM Pager [phone]

## 2019-06-21 DIAGNOSIS — I10 Essential (primary) hypertension: Secondary | ICD-10-CM

## 2019-06-21 DIAGNOSIS — Z794 Long term (current) use of insulin: Secondary | ICD-10-CM

## 2019-06-21 DIAGNOSIS — E1159 Type 2 diabetes mellitus with other circulatory complications: Secondary | ICD-10-CM

## 2019-06-21 DIAGNOSIS — E1169 Type 2 diabetes mellitus with other specified complication: Secondary | ICD-10-CM

## 2019-06-21 LAB — CBC
HCT: 49 % (ref 39.0–52.0)
Hemoglobin: 16.1 g/dL (ref 13.0–17.0)
MCH: 30.9 pg (ref 26.0–34.0)
MCHC: 32.9 g/dL (ref 30.0–36.0)
MCV: 94 fL (ref 80.0–100.0)
Platelets: 164 10*3/uL (ref 150–400)
RBC: 5.21 MIL/uL (ref 4.22–5.81)
RDW: 15.1 % (ref 11.5–15.5)
WBC: 7.7 10*3/uL (ref 4.0–10.5)
nRBC: 0 % (ref 0.0–0.2)

## 2019-06-21 LAB — COMPREHENSIVE METABOLIC PANEL
ALT: 167 U/L — ABNORMAL HIGH (ref 0–44)
AST: 96 U/L — ABNORMAL HIGH (ref 15–41)
Albumin: 3 g/dL — ABNORMAL LOW (ref 3.5–5.0)
Alkaline Phosphatase: 324 U/L — ABNORMAL HIGH (ref 38–126)
Anion gap: 8 (ref 5–15)
BUN: 16 mg/dL (ref 8–23)
CO2: 24 mmol/L (ref 22–32)
Calcium: 9 mg/dL (ref 8.9–10.3)
Chloride: 101 mmol/L (ref 98–111)
Creatinine, Ser: 0.74 mg/dL (ref 0.61–1.24)
GFR calc Af Amer: >60 mL/min (ref >60)
GFR calc non Af Amer: >60 mL/min (ref >60)
Glucose, Bld: 203 mg/dL — ABNORMAL HIGH (ref 70–99)
Potassium: 4.6 mmol/L (ref 3.5–5.1)
Sodium: 133 mmol/L — ABNORMAL LOW (ref 135–145)
Total Bilirubin: 12.5 mg/dL — ABNORMAL HIGH (ref 0.3–1.2)
Total Protein: 6 g/dL — ABNORMAL LOW (ref 6.5–8.1)

## 2019-06-21 LAB — GLUCOSE, CAPILLARY
Glucose-Capillary: 207 mg/dL — ABNORMAL HIGH (ref 70–99)
Glucose-Capillary: 197 mg/dL — ABNORMAL HIGH (ref 70–99)
Glucose-Capillary: 169 mg/dL — ABNORMAL HIGH (ref 70–99)
Glucose-Capillary: 233 mg/dL — ABNORMAL HIGH (ref 70–99)

## 2019-06-21 LAB — PLATELET INHIBITION P2Y12: Platelet Function  P2Y12: 63 [PRU] — ABNORMAL LOW (ref 182–335)

## 2019-06-21 MED ORDER — POLYETHYLENE GLYCOL 3350 17 G PO PACK
17.0000 g | PACK | Freq: Every day | ORAL | Status: DC | PRN
  Administered 2019-06-21: 17 g via ORAL
  Filled 2019-06-21: qty 1

## 2019-06-21 MED ORDER — BISACODYL 10 MG RE SUPP
10.0000 mg | Freq: Once | RECTAL | Status: AC
  Administered 2019-06-21: 10 mg via RECTAL
  Filled 2019-06-21: qty 1

## 2019-06-21 MED ORDER — HYDROXYZINE HCL 25 MG PO TABS
25.0000 mg | ORAL_TABLET | Freq: Three times a day (TID) | ORAL | Status: DC | PRN
  Administered 2019-06-21: 25 mg via ORAL
  Filled 2019-06-21: qty 1

## 2019-06-21 NOTE — Progress Notes (Signed)
Inpatient Diabetes Program Recommendations  AACE/ADA: New Consensus Statement on Inpatient Glycemic Control (2015)  Target Ranges:  Prepandial:   less than 140 mg/dL      Peak postprandial:   less than 180 mg/dL (1-2 hours)      Critically ill patients:  140 - 180 mg/dL   Lab Results  Component Value Date   GLUCAP 197 (H) 06/21/2019   HGBA1C 7.3 (H) 06/19/2019    Review of Glycemic Control  Glucose 203, 207 this am. May benefit from adding part of home basal insulin. HgbA1C - 7.3%  Inpatient Diabetes Program Recommendations:     Add Levemir 12 units QHS  Will continue to follow.   Thank you. Lorenda Peck, RD, LDN, CDE Inpatient Diabetes Coordinator [phone]

## 2019-06-21 NOTE — Progress Notes (Addendum)
   PLT inhibition very active   Will check p2Y12 again Sat PM  Resume diet  I will check him tomorrow.   Gatha Mayer, MD, [facility]enter[facility] 06/21/2019 9:48 AM Pager [phone]

## 2019-06-21 NOTE — Progress Notes (Signed)
PROGRESS NOTE    Ricky Cabrera  [id number] DOB: 1955-10-14 DOA: 06/19/2019 PCP: Antonietta Jewel, MD    Brief Narrative:  63 year old male with a history of coronary artery disease, diabetes, hypertension, cholecystectomy, presents to the hospital with abdominal pain, nausea and vomiting.  Found to have elevated LFTs and CT evidence of choledocholithiasis with possible cholangitis.  Started on intravenous antibiotics.  GI following.   Assessment & Plan:   Principal Problem:   Choledocholithiasis Active Problems:   Transaminitis   CAD (coronary artery disease)   Diabetes mellitus ([place])   Hypertension associated with diabetes ([place])   Cholangitis   Erythrocytosis   1. Cholangitis with choledocholithiasis.  Patient is status post cholecystectomy in 01/2017.  He presents with elevated LFTs, transaminases and bilirubin as well as abdominal pain, nausea and vomiting.  Imaging indicated dilation of biliary tree and findings concerning for superimposed cholangitis.  Seen by GI with plans for ERCP and sphincterotomy.  Complicating issues that he is chronically on Plavix.  Since he is currently stable, GI is electing to wait for Plavix effect to wear off prior to any procedures.  P2Y 12 continue to show significant platelet elevation this morning.  This will be rechecked tomorrow.  Continue IV antibiotics for now. 2. History of coronary artery status post March/2015.  Statin has been held due to elevated LFTs.  Plavix is on hold for potential procedure. 3. Type 2 diabetes.  Home dose of Levemir has been held.  Currently on sliding scale insulin with stable blood sugars 4. Hypertension.  Blood pressure currently stable.  Continued on lisinopril and atenolol   DVT prophylaxis: SCDs Code Status: Full code Family Communication: Discussed with wife at the bedside Disposition Plan: Discharge home once improved   Consultants:   Gastroenterology, Dr. Carlean Purl  Procedures:     Antimicrobials:    Ciprofloxacin 9/30>  Flagyl 9/30 >   Subjective: Abdominal pain is reasonably controlled.  No nausea or vomiting.  Has not had a bowel movement in several days.  Objective: Vitals:   06/20/19 1344 06/20/19 2130 06/21/19 0631 06/21/19 1333  BP: 137/76 140/76 133/80 136/75  Pulse: 65 64 66 63  Resp: 18 18 18    Temp: 98.1 F (36.7 C) 98.5 F (36.9 C) 98.4 F (36.9 C) 97.9 F (36.6 C)  TempSrc: Oral Oral Oral Oral  SpO2: 93% 99% 97% 97%  Weight:   91.2 kg   Height:        Intake/Output Summary (Last 24 hours) at 06/21/2019 1709 Last data filed at 06/21/2019 1407 Gross per 24 hour  Intake 1500.07 ml  Output 0 ml  Net 1500.07 ml   Filed Weights   06/19/19 1546 06/20/19 0500 06/21/19 0631  Weight: 93 kg 93.1 kg 91.2 kg    Examination:  General exam: Appears calm and comfortable HEENT: Scleral icterus bilaterally Respiratory system: Clear to auscultation. Respiratory effort normal. Cardiovascular system: S1 & S2 heard, RRR. No JVD, murmurs, rubs, gallops or clicks. No pedal edema. Gastrointestinal system: Abdomen is nondistended, soft and mildly tender in right upper quadrant. No organomegaly or masses felt. Normal bowel sounds heard. Central nervous system: Alert and oriented. No focal neurological deficits. Extremities: Symmetric 5 x 5 power. Skin: No rashes, lesions or ulcers Psychiatry: Judgement and insight appear normal. Mood & affect appropriate.     Data Reviewed: I have personally reviewed following labs and imaging studies  CBC: Recent Labs  Lab 06/19/19 1024 06/19/19 1704 06/21/19 0619  WBC 12.6* 11.8* 7.7  NEUTROABS  9.5*  --   --   HGB 17.8* 17.6* 16.1  HCT 53.0* 53.1* 49.0  MCV 92.1 92.8 94.0  PLT 188.0 185 [id number]   Basic Metabolic Panel: Recent Labs  Lab 06/19/19 1024 06/19/19 1704 06/20/19 0350 06/21/19 0619  NA 133* 132* 133* 133*  K 4.5 5.3* 4.4 4.6  CL 97 98 98 101  CO2 26 22 27 24   GLUCOSE 221* 163* 213* 203*  BUN 21 23 19 16    CREATININE 1.13 0.96 0.94 0.74  CALCIUM 10.1 9.4 8.7* 9.0   GFR: Estimated Creatinine Clearance: 112.7 mL/min (by C-G formula based on SCr of 0.74 mg/dL). Liver Function Tests: Recent Labs  Lab 06/19/19 1024 06/19/19 1704 06/20/19 0350 06/21/19 0619  AST 102* 96* 99* 96*  ALT 180* 178* 163* 167*  ALKPHOS 376* 319* 304* 324*  BILITOT 11.1* 9.4* 10.4* 12.5*  PROT 7.6 7.5 6.5 6.0*  ALBUMIN 4.2 3.7 3.1* 3.0*   Recent Labs  Lab 06/19/19 1704  LIPASE 28   No results for input(s): AMMONIA in the last 168 hours. Coagulation Profile: No results for input(s): INR, PROTIME in the last 168 hours. Cardiac Enzymes: No results for input(s): CKTOTAL, CKMB, CKMBINDEX, TROPONINI in the last 168 hours. BNP (last 3 results) No results for input(s): PROBNP in the last 8760 hours. HbA1C: Recent Labs    06/19/19 1703  HGBA1C 7.3*   CBG: Recent Labs  Lab 06/20/19 1125 06/20/19 1644 06/20/19 2134 06/21/19 0726 06/21/19 1117  GLUCAP 226* 192* 247* 207* 197*   Lipid Profile: No results for input(s): CHOL, HDL, LDLCALC, TRIG, CHOLHDL, LDLDIRECT in the last 72 hours. Thyroid Function Tests: No results for input(s): TSH, T4TOTAL, FREET4, T3FREE, THYROIDAB in the last 72 hours. Anemia Panel: No results for input(s): VITAMINB12, FOLATE, FERRITIN, TIBC, IRON, RETICCTPCT in the last 72 hours. Sepsis Labs: No results for input(s): PROCALCITON, LATICACIDVEN in the last 168 hours.  Recent Results (from the past 240 hour(s))  SARS CORONAVIRUS 2 (TAT 6-24 HRS) Nasopharyngeal Nasopharyngeal Swab     Status: None   Collection Time: 06/19/19 11:43 PM   Specimen: Nasopharyngeal Swab  Result Value Ref Range Status   SARS Coronavirus 2 NEGATIVE NEGATIVE Final    Comment: (NOTE) SARS-CoV-2 target nucleic acids are NOT DETECTED. The SARS-CoV-2 RNA is generally detectable in upper and lower respiratory specimens during the acute phase of infection. Negative results do not preclude SARS-CoV-2  infection, do not rule out co-infections with other pathogens, and should not be used as the sole basis for treatment or other patient management decisions. Negative results must be combined with clinical observations, patient history, and epidemiological information. The expected result is Negative. Fact Sheet for Patients: SugarRoll.be Fact Sheet for Healthcare Providers: https://www.woods-mathews.com/ This test is not yet approved or cleared by the [place] FDA and  has been authorized for detection and/or diagnosis of SARS-CoV-2 by FDA under an Emergency Use Authorization (EUA). This EUA will remain  in effect (meaning this test can be used) for the duration of the COVID-19 declaration under Section 56 4(b)(1) of the Act, 21 U.S.C. section 360bbb-3(b)(1), unless the authorization is terminated or revoked sooner. Performed at [facility], [place] [address], [place], [place] [address]          Radiology Studies: Ct Abdomen Pelvis W Contrast  Result Date: 06/19/2019 CLINICAL DATA:  63 year old male with known cholangitis presenting for follow-up. EXAM: CT ABDOMEN AND PELVIS WITH CONTRAST TECHNIQUE: Multidetector CT imaging of the abdomen and pelvis was  performed using the standard protocol following bolus administration of intravenous contrast. CONTRAST:  160mL OMNIPAQUE IOHEXOL 300 MG/ML  SOLN COMPARISON:  Abdominal ultrasound dated 01/12/2017 FINDINGS: Lower chest: The visualized lung bases are clear. Advanced multi vessel coronary vascular calcification noted. Intra-abdominal free air or free fluid. Hepatobiliary: Apparent fatty infiltration of the liver. There is dilatation of the intrahepatic biliary tree as well as common bile duct. Several noncalcified adjacent stones noted in the central CBD with a combined length of 18 mm (coronal series 5 image 76). The common bile duct measures approximately 11 mm in diameter. Mild haziness of  the fat surrounding the biliary tree concerning for superimposed infection/cholangitis. Further evaluation with MRCP is recommended. Pancreas: Unremarkable. No pancreatic ductal dilatation or surrounding inflammatory changes. Spleen: Normal in size without focal abnormality. Adrenals/Urinary Tract: The adrenal glands are unremarkable. There is no hydronephrosis on either side. There is symmetric enhancement and excretion of contrast by both kidneys. Several hypodense lesion in the left kidney measure up to 1.5 cm in the inferior pole. The larger lesions demonstrate fluid attenuation most consistent with cysts and the smaller lesions are too small to characterize. Ultrasound may provide better evaluation on a nonemergent basis. The visualized ureters and urinary bladder appear unremarkable. Stomach/Bowel: There is no bowel obstruction or active inflammation. The appendix is normal. Vascular/Lymphatic: Moderate aortoiliac atherosclerotic disease. The IVC is grossly unremarkable. No portal venous gas. There is no adenopathy. Reproductive: The prostate and seminal vesicles are grossly unremarkable. No pelvic mass Other: Diffuse skin thickening and stranding of the subcutaneous soft tissues of the anterior abdominal wall. No fluid collection. Musculoskeletal: Degenerative changes of the spine. No acute osseous pathology. IMPRESSION: 1. Choledocholithiasis with dilatation of the biliary tree and findings concerning for superimposed cholangitis. Further evaluation with MRCP is recommended. 2. No bowel obstruction or active inflammation. Normal appendix. Aortic Atherosclerosis (ICD10-I70.0). Electronically Signed   By: Anner Crete M.D.   On: 06/19/2019 22:55        Scheduled Meds: . atenolol  100 mg Oral Daily  . insulin aspart  0-15 Units Subcutaneous TID WC  . lisinopril  2.5 mg Oral Daily   Continuous Infusions: . ciprofloxacin 400 mg (06/21/19 0912)  . metronidazole 500 mg (06/21/19 1635)     LOS:  1 day    Time spent: 65mins    Kathie Dike, MD [facility]   If 7PM-7AM, please contact night-coverage [email or web address]  06/21/2019, 5:09 PM

## 2019-06-22 DIAGNOSIS — R7401 Elevation of levels of liver transaminase levels: Secondary | ICD-10-CM

## 2019-06-22 DIAGNOSIS — I2583 Coronary atherosclerosis due to lipid rich plaque: Secondary | ICD-10-CM

## 2019-06-22 DIAGNOSIS — I2510 Atherosclerotic heart disease of native coronary artery without angina pectoris: Secondary | ICD-10-CM

## 2019-06-22 LAB — GLUCOSE, CAPILLARY
Glucose-Capillary: 251 mg/dL — ABNORMAL HIGH (ref 70–99)
Glucose-Capillary: 243 mg/dL — ABNORMAL HIGH (ref 70–99)
Glucose-Capillary: 198 mg/dL — ABNORMAL HIGH (ref 70–99)
Glucose-Capillary: 192 mg/dL — ABNORMAL HIGH (ref 70–99)

## 2019-06-22 LAB — COMPREHENSIVE METABOLIC PANEL
ALT: 149 U/L — ABNORMAL HIGH (ref 0–44)
AST: 76 U/L — ABNORMAL HIGH (ref 15–41)
Albumin: 3 g/dL — ABNORMAL LOW (ref 3.5–5.0)
Alkaline Phosphatase: 309 U/L — ABNORMAL HIGH (ref 38–126)
Anion gap: 7 (ref 5–15)
BUN: 18 mg/dL (ref 8–23)
CO2: 26 mmol/L (ref 22–32)
Calcium: 8.7 mg/dL — ABNORMAL LOW (ref 8.9–10.3)
Chloride: 102 mmol/L (ref 98–111)
Creatinine, Ser: 1.1 mg/dL (ref 0.61–1.24)
GFR calc Af Amer: >60 mL/min (ref >60)
GFR calc non Af Amer: >60 mL/min (ref >60)
Glucose, Bld: 226 mg/dL — ABNORMAL HIGH (ref 70–99)
Potassium: 4.1 mmol/L (ref 3.5–5.1)
Sodium: 135 mmol/L (ref 135–145)
Total Bilirubin: 10.9 mg/dL — ABNORMAL HIGH (ref 0.3–1.2)
Total Protein: 6.3 g/dL — ABNORMAL LOW (ref 6.5–8.1)

## 2019-06-22 MED ORDER — CIPROFLOXACIN IN D5W 400 MG/200ML IV SOLN
400.0000 mg | Freq: Two times a day (BID) | INTRAVENOUS | Status: DC
  Administered 2019-06-22 – 2019-06-25 (×6): 400 mg via INTRAVENOUS
  Filled 2019-06-22 (×6): qty 200

## 2019-06-22 MED ORDER — INSULIN DETEMIR 100 UNIT/ML ~~LOC~~ SOLN
10.0000 [IU] | Freq: Every day | SUBCUTANEOUS | Status: DC
  Administered 2019-06-22: 10:00:00 10 [IU] via SUBCUTANEOUS
  Filled 2019-06-22 (×2): qty 0.1

## 2019-06-22 NOTE — Progress Notes (Signed)
PROGRESS NOTE    Ricky Cabrera  [id number] DOB: Nov 04, 1955 DOA: 06/19/2019 PCP: Antonietta Jewel, MD      Brief Narrative:  Ricky Cabrera is a 63 y.o. M with CAD s/p PCI 2015, DM, HTN, and choledocholithiasis in 2018 who presented with few months intermittent epigastric pain/vomiting and now two weeks worsening pain as well as jaundice.    Seen in outpatient GI office day of admission, noted to have transaminitis, sent for admission.  In ER, AST/ALT 96/178, CT showed choledocholithiasis with possilbe superimposed cholangitis.       Assessment & Plan:  Cholangitis Choledocholithiasis Afebrile, pain improving.   Needs ERCP but can't due to Plavix. -Consult GI, appreciate cares -Check P2y12 fcn today -Hopefully ERCP tomorrow  -Continue Cipro/flagyl    Coronary disease PCI last in 2015.  On Plavix. -Hold Plavix  Hypertension BP controlled -Continue atenolol, lisionpril  Diabetes GLucoses elevated -Continue SSI corrections -Restart Levemir  Hyponatremia Mild, resolved        MDM and disposition: The below labs and imaging reports were reviewed and summarized above.  Medication management as above.  The patient was admitted with cholangitis and choledocholithiasis.  He needs definitiv therapy with ERCP and sphincterotomy, but this needs to wait until Plavix washout.          DVT prophylaxis: SCDs Code Status: FULL Family Communication:     Consultants:   Gastroenterology  Procedures:   9/30 CT abdomen  Antimicrobials:   Cipro 9/30 >>  Flagyl 9/30 >>    Subjective: Mild RUQ pain.  Knee pain, which is chronic.  No vomiting, fever.  No confusion.  Objective: Vitals:   06/21/19 1333 06/21/19 2041 06/22/19 0500 06/22/19 0537  BP: 136/75 116/72  130/75  Pulse: 63 65  67  Resp:  18  18  Temp: 97.9 F (36.6 C) 98.7 F (37.1 C)  98.9 F (37.2 C)  TempSrc: Oral Oral  Oral  SpO2: 97% 95%  97%  Weight:   93.3 kg   Height:         Intake/Output Summary (Last 24 hours) at 06/22/2019 0932 Last data filed at 06/22/2019 0600 Gross per 24 hour  Intake 2097.16 ml  Output -  Net 2097.16 ml   Filed Weights   06/20/19 0500 06/21/19 0631 06/22/19 0500  Weight: 93.1 kg 91.2 kg 93.3 kg    Examination: General appearance:  adult male, alert and in no acute distress.   HEENT: Mildly icteric, conjunctiva pink, lids and lashes normal. No nasal deformity, discharge, epistaxis.  Lips moist, dentition normal, OP moist, no orla lesions, hearing normal.   Skin: Warm and dry.  Mild jaundice.  No suspicious rashes or lesions. Cardiac: RRR, nl S1-S2, no murmurs appreciated.  Capillary refill is brisk.  JVP normal.  No LE edema.  Radial pulses 2+ and symmetric. Respiratory: Normal respiratory rate and rhythm.  CTAB without rales or wheezes. Abdomen: Abdomen soft.  Mild RUQ TTP wtihout guarding. No ascites, distension, hepatosplenomegaly.   MSK: No deformities or effusions. Neuro: Awake and alert.  EOMI, moves all extremities. Speech fluent.    Psych: Sensorium intact and responding to questions, attention normal. Affect normal.  Judgment and insight appear normal.    Data Reviewed: I have personally reviewed following labs and imaging studies:  CBC: Recent Labs  Lab 06/19/19 1024 06/19/19 1704 06/21/19 0619  WBC 12.6* 11.8* 7.7  NEUTROABS 9.5*  --   --   HGB 17.8* 17.6* 16.1  HCT 53.0* 53.1* 49.0  MCV  92.1 92.8 94.0  PLT 188.0 185 123456   Basic Metabolic Panel: Recent Labs  Lab 06/19/19 1024 06/19/19 1704 06/20/19 0350 06/21/19 0619 06/22/19 0351  NA 133* 132* 133* 133* 135  K 4.5 5.3* 4.4 4.6 4.1  CL 97 98 98 101 102  CO2 26 22 27 24 26   GLUCOSE 221* 163* 213* 203* 226*  BUN 21 23 19 16 18   CREATININE 1.13 0.96 0.94 0.74 1.10  CALCIUM 10.1 9.4 8.7* 9.0 8.7*   GFR: Estimated Creatinine Clearance: 82 mL/min (by C-G formula based on SCr of 1.1 mg/dL). Liver Function Tests: Recent Labs  Lab 06/19/19 1024  06/19/19 1704 06/20/19 0350 06/21/19 0619 06/22/19 0351  AST 102* 96* 99* 96* 76*  ALT 180* 178* 163* 167* 149*  ALKPHOS 376* 319* 304* 324* 309*  BILITOT 11.1* 9.4* 10.4* 12.5* 10.9*  PROT 7.6 7.5 6.5 6.0* 6.3*  ALBUMIN 4.2 3.7 3.1* 3.0* 3.0*   Recent Labs  Lab 06/19/19 1704  LIPASE 28   No results for input(s): AMMONIA in the last 168 hours. Coagulation Profile: No results for input(s): INR, PROTIME in the last 168 hours. Cardiac Enzymes: No results for input(s): CKTOTAL, CKMB, CKMBINDEX, TROPONINI in the last 168 hours. BNP (last 3 results) No results for input(s): PROBNP in the last 8760 hours. HbA1C: Recent Labs    06/19/19 1703  HGBA1C 7.3*   CBG: Recent Labs  Lab 06/21/19 0726 06/21/19 1117 06/21/19 1636 06/21/19 2130 06/22/19 0801  GLUCAP 207* 197* 169* 233* 251*   Lipid Profile: No results for input(s): CHOL, HDL, LDLCALC, TRIG, CHOLHDL, LDLDIRECT in the last 72 hours. Thyroid Function Tests: No results for input(s): TSH, T4TOTAL, FREET4, T3FREE, THYROIDAB in the last 72 hours. Anemia Panel: No results for input(s): VITAMINB12, FOLATE, FERRITIN, TIBC, IRON, RETICCTPCT in the last 72 hours. Urine analysis:    Component Value Date/Time   COLORURINE AMBER (A) 06/19/2019 2343   APPEARANCEUR CLEAR 06/19/2019 2343   LABSPEC 1.009 06/19/2019 2343   PHURINE 5.0 06/19/2019 2343   GLUCOSEU NEGATIVE 06/19/2019 2343   HGBUR NEGATIVE 06/19/2019 2343   BILIRUBINUR NEGATIVE 06/19/2019 2343   KETONESUR NEGATIVE 06/19/2019 2343   PROTEINUR NEGATIVE 06/19/2019 2343   NITRITE NEGATIVE 06/19/2019 2343   LEUKOCYTESUR NEGATIVE 06/19/2019 2343   Sepsis Labs: @LABRCNTIP (procalcitonin:4,lacticacidven:4)  ) Recent Results (from the past 240 hour(s))  SARS CORONAVIRUS 2 (TAT 6-24 HRS) Nasopharyngeal Nasopharyngeal Swab     Status: None   Collection Time: 06/19/19 11:43 PM   Specimen: Nasopharyngeal Swab  Result Value Ref Range Status   SARS Coronavirus 2 NEGATIVE  NEGATIVE Final    Comment: (NOTE) SARS-CoV-2 target nucleic acids are NOT DETECTED. The SARS-CoV-2 RNA is generally detectable in upper and lower respiratory specimens during the acute phase of infection. Negative results do not preclude SARS-CoV-2 infection, do not rule out co-infections with other pathogens, and should not be used as the sole basis for treatment or other patient management decisions. Negative results must be combined with clinical observations, patient history, and epidemiological information. The expected result is Negative. Fact Sheet for Patients: SugarRoll.be Fact Sheet for Healthcare Providers: https://www.woods-mathews.com/ This test is not yet approved or cleared by the [place] FDA and  has been authorized for detection and/or diagnosis of SARS-CoV-2 by FDA under an Emergency Use Authorization (EUA). This EUA will remain  in effect (meaning this test can be used) for the duration of the COVID-19 declaration under Section 56 4(b)(1) of the Act, 21 U.S.C. section 360bbb-3(b)(1), unless the  authorization is terminated or revoked sooner. Performed at [facility], [place] [address], [place], [place] [address]          Radiology Studies: No results found.      Scheduled Meds: . atenolol  100 mg Oral Daily  . insulin aspart  0-15 Units Subcutaneous TID WC  . lisinopril  2.5 mg Oral Daily   Continuous Infusions: . ciprofloxacin 400 mg (06/21/19 2257)  . metronidazole 500 mg (06/22/19 0815)     LOS: 2 days    Time spent: 25 minutes    Edwin Dada, MD [facility] 06/22/2019, 9:32 AM     Please page through [place]:  [email or web address] Password [email or web address] If 7PM-7AM, please contact night-coverage

## 2019-06-22 NOTE — Progress Notes (Addendum)
Patient ID: Ricky Cabrera, male   DOB: 07-24-1956, 63 y.o.   MRN: [id number]    Progress Note   Subjective  Day # 3 CC; choledocholithiasis/cholangitis  Patient says he feels pretty good, continues to have some epigastric pain not severe.  No current issues with nausea or vomiting.  Afebrile overnight  WBC yesterday 7.7 Today T bili 10.9/alk phos 309/AST 76/ALT 149  Day #3  off Plavix  On IV metronidazole and Cipro     Objective   Vital signs in last 24 hours: Temp:  [97.9 F (36.6 C)-98.9 F (37.2 C)] 98.9 F (37.2 C) (10/03 0537) Pulse Rate:  [63-67] 67 (10/03 0537) Resp:  [18] 18 (10/03 0537) BP: (116-136)/(72-75) 130/75 (10/03 0537) SpO2:  [95 %-97 %] 97 % (10/03 0537) Weight:  [93.3 kg] 93.3 kg (10/03 0500) Last BM Date: 06/21/19 General:    Older white male in NAD, jaundiced Heart:  Regular rate and rhythm; no murmurs Lungs: Respirations even and unlabored, lungs CTA bilaterally Abdomen:  Soft, tender epigastrium and nondistended. Normal bowel sounds. Extremities:  Without edema. Neurologic:  Alert and oriented,  grossly normal neurologically. Psych:  Cooperative. Normal mood and affect.  Intake/Output from previous day: 10/02 0701 - 10/03 0700 In: 2238.3 [P.O.:1320; IV Piggyback:918.3] Out: -  Intake/Output this shift: No intake/output data recorded.  Lab Results: Recent Labs    06/19/19 1024 06/19/19 1704 06/21/19 0619  WBC 12.6* 11.8* 7.7  HGB 17.8* 17.6* 16.1  HCT 53.0* 53.1* 49.0  PLT 188.0 185 164   BMET Recent Labs    06/20/19 0350 06/21/19 0619 06/22/19 0351  NA 133* 133* 135  K 4.4 4.6 4.1  CL 98 101 102  CO2 '27 24 26  ' GLUCOSE 213* 203* 226*  BUN '19 16 18  ' CREATININE 0.94 0.74 1.10  CALCIUM 8.7* 9.0 8.7*   LFT Recent Labs    06/22/19 0351  PROT 6.3*  ALBUMIN 3.0*  AST 76*  ALT 149*  ALKPHOS 309*  BILITOT 10.9*   PT/INR No results for input(s): LABPROT, INR in the last 72 hours.       Assessment / Plan:    #80  63 year old male admitted with intermittent abdominal pain nausea vomiting and new onset of jaundice. Patient is status post cholecystectomy 2018, and found on imaging 06/19/2019 to have dilation of the intrahepatic and common bile duct with several noncalcified stones in the central CBD.  CBD 11 mm with mild haziness of the fat surrounding concerning for cholangitis  Patient is doing well, leukocytosis has resolved.  He is afebrile Continues on IV metronidazole and IV Cipro.  Jaundice persists.  #2 chronic antiplatelet therapy-had been on Plavix, off since admit #3 coronary artery disease status post DES to LAD 11/2013 #4 adult onset diabetes mellitus  Plan; continue solid diet today, n.p.o. after midnight Continue current IV antibiotics Patient tentatively scheduled for ERCP with stone extraction tomorrow with Dr. Carlean Purl. P2 Y 12 to be checked again this afternoon, timing of ERCP will be dependent on level of platelet inhibition.  Patient aware and agreeable with plan.     Principal Problem:   Choledocholithiasis Active Problems:   Transaminitis   CAD (coronary artery disease)   Diabetes mellitus ([place])   Hypertension associated with diabetes ([place])   Cholangitis   Erythrocytosis     LOS: 2 days   Amy Esterwood PA-C 06/22/2019, 8:54 AM

## 2019-06-23 LAB — COMPREHENSIVE METABOLIC PANEL
ALT: 137 U/L — ABNORMAL HIGH (ref 0–44)
AST: 74 U/L — ABNORMAL HIGH (ref 15–41)
Albumin: 3 g/dL — ABNORMAL LOW (ref 3.5–5.0)
Alkaline Phosphatase: 308 U/L — ABNORMAL HIGH (ref 38–126)
Anion gap: 8 (ref 5–15)
BUN: 17 mg/dL (ref 8–23)
CO2: 27 mmol/L (ref 22–32)
Calcium: 8.9 mg/dL (ref 8.9–10.3)
Chloride: 101 mmol/L (ref 98–111)
Creatinine, Ser: 0.75 mg/dL (ref 0.61–1.24)
GFR calc Af Amer: >60 mL/min (ref >60)
GFR calc non Af Amer: >60 mL/min (ref >60)
Glucose, Bld: 158 mg/dL — ABNORMAL HIGH (ref 70–99)
Potassium: 4.5 mmol/L (ref 3.5–5.1)
Sodium: 136 mmol/L (ref 135–145)
Total Bilirubin: 12.5 mg/dL — ABNORMAL HIGH (ref 0.3–1.2)
Total Protein: 6.4 g/dL — ABNORMAL LOW (ref 6.5–8.1)

## 2019-06-23 LAB — CBC
HCT: 46.7 % (ref 39.0–52.0)
Hemoglobin: 15.7 g/dL (ref 13.0–17.0)
MCH: 30.9 pg (ref 26.0–34.0)
MCHC: 33.6 g/dL (ref 30.0–36.0)
MCV: 91.9 fL (ref 80.0–100.0)
Platelets: 188 10*3/uL (ref 150–400)
RBC: 5.08 MIL/uL (ref 4.22–5.81)
RDW: 15.1 % (ref 11.5–15.5)
WBC: 8.4 10*3/uL (ref 4.0–10.5)
nRBC: 0 % (ref 0.0–0.2)

## 2019-06-23 LAB — GLUCOSE, CAPILLARY
Glucose-Capillary: 185 mg/dL — ABNORMAL HIGH (ref 70–99)
Glucose-Capillary: 200 mg/dL — ABNORMAL HIGH (ref 70–99)
Glucose-Capillary: 206 mg/dL — ABNORMAL HIGH (ref 70–99)
Glucose-Capillary: 213 mg/dL — ABNORMAL HIGH (ref 70–99)

## 2019-06-23 LAB — PLATELET INHIBITION P2Y12: Platelet Function  P2Y12: 59 [PRU] — ABNORMAL LOW (ref 182–335)

## 2019-06-23 MED ORDER — INSULIN DETEMIR 100 UNIT/ML ~~LOC~~ SOLN
14.0000 [IU] | Freq: Every day | SUBCUTANEOUS | Status: DC
  Administered 2019-06-23 – 2019-06-25 (×3): 14 [IU] via SUBCUTANEOUS
  Filled 2019-06-23 (×3): qty 0.14

## 2019-06-23 NOTE — Progress Notes (Signed)
PROGRESS NOTE    Ricky Cabrera  [id number] DOB: [birth year] DOA: 06/19/2019 PCP: Antonietta Jewel, MD      Brief Narrative:  Ricky Cabrera is a 63 y.o. M with CAD s/p PCI 2015, DM, HTN, and choledocholithiasis in 2018 who presented with few months intermittent epigastric pain/vomiting and now two weeks worsening pain as well as jaundice.    Seen in outpatient GI office day of admission, noted to have transaminitis, sent for admission.  In ER, AST/ALT 96/178, CT showed choledocholithiasis with possilbe superimposed cholangitis.       Assessment & Plan:  Cholangitis Choledocholithiasis Transaminitis Remains afebrile.  Pain still present but stable, mild. Needs ERCP but can't due to Plavix.  Last dose Plavix Weds AM, there was some hope to use P2y12 assay to guide earlier ERCP, but it was still depressed last night, so ERCP will have to wait until tomorrow.  LFTs/Bili no change -Consult GI, appreciate cares -Plan for ERCP tomorrow  -Continue Cipro/flagyl    Coronary disease PCI last in 2015.  On Plavix. -Hold Plavix  Hypertension BP controlled -Continue atenolol, lisionpril  Diabetes GLucoses improved but still high -Continue SSI -Continue Levemir, increase dose  Hyponatremia Mild, resolved        MDM and disposition: The below labs and imaging reports were reviewed and summarized above.  Medication management as above.  The patient was admitted with cholangitis and choledocholithiasis.  He needs definitiv therapy with ERCP and sphincterotomy, but this needs to wait until Plavix washout.          DVT prophylaxis: SCDs Code Status: FULL Family Communication:     Consultants:   Gastroenterology  Procedures:   9/30 CT abdomen  Antimicrobials:   Cipro 9/30 >>  Flagyl 9/30 >>    Subjective: Knee pain, chronic, no change.  Mild RUQ pain.  No fever, vomiting,.  No respiratory distress, confusion.        Objective: Vitals:   06/22/19  0537 06/22/19 1453 06/22/19 2133 06/23/19 0415  BP: 130/75 127/74 (!) 142/77 130/75  Pulse: 67 62 62 66  Resp: 18 18 18 16   Temp: 98.9 F (37.2 C) 98 F (36.7 C) 97.7 F (36.5 C) 97.8 F (36.6 C)  TempSrc: Oral Oral Oral Oral  SpO2: 97% 98% 97% 98%  Weight:    93.6 kg  Height:        Intake/Output Summary (Last 24 hours) at 06/23/2019 0951 Last data filed at 06/23/2019 0200 Gross per 24 hour  Intake 1513.92 ml  Output --  Net 1513.92 ml   Filed Weights   06/21/19 0631 06/22/19 0500 06/23/19 0415  Weight: 91.2 kg 93.3 kg 93.6 kg    Examination: General appearance:  Adult male, lying in bed, reding newspaper, no acute distress HEENT: Mildly icteric, conjunctiva lids and lashes normal.  No nasal deformity, disharge.    Skin: Warm and dry, mild jaundice. Cardiac: RRR, no murmurs, JVP normal, no LE edema.    Respiratory: RR normal, lungs clear to auscultation. Abdomen: Abdomen soft, no guarding, mild RUQ TTP.  No distension, ascites.   MSK: No deformities or effusions. Neuro: Awake and alert, EOMI, move all extremities with normal strength and coordination, speech fluent    Psych: Attention, affect, judgment and insight all normal    Data Reviewed: I have personally reviewed following labs and imaging studies:  CBC: Recent Labs  Lab 06/19/19 1024 06/19/19 1704 06/21/19 0619 06/23/19 0251  WBC 12.6* 11.8* 7.7 8.4  NEUTROABS 9.5*  --   --   --  HGB 17.8* 17.6* 16.1 15.7  HCT 53.0* 53.1* 49.0 46.7  MCV 92.1 92.8 94.0 91.9  PLT 188.0 185 164 0000000   Basic Metabolic Panel: Recent Labs  Lab 06/19/19 1704 06/20/19 0350 06/21/19 0619 06/22/19 0351 06/23/19 0251  NA 132* 133* 133* 135 136  K 5.3* 4.4 4.6 4.1 4.5  CL 98 98 101 102 101  CO2 22 27 24 26 27   GLUCOSE 163* 213* 203* 226* 158*  BUN 23 19 16 18 17   CREATININE 0.96 0.94 0.74 1.10 0.75  CALCIUM 9.4 8.7* 9.0 8.7* 8.9   GFR: Estimated Creatinine Clearance: 112.7 mL/min (by C-G formula based on SCr of 0.75  mg/dL). Liver Function Tests: Recent Labs  Lab 06/19/19 1704 06/20/19 0350 06/21/19 0619 06/22/19 0351 06/23/19 0251  AST 96* 99* 96* 76* 74*  ALT 178* 163* 167* 149* 137*  ALKPHOS 319* 304* 324* 309* 308*  BILITOT 9.4* 10.4* 12.5* 10.9* 12.5*  PROT 7.5 6.5 6.0* 6.3* 6.4*  ALBUMIN 3.7 3.1* 3.0* 3.0* 3.0*   Recent Labs  Lab 06/19/19 1704  LIPASE 28   No results for input(s): AMMONIA in the last 168 hours. Coagulation Profile: No results for input(s): INR, PROTIME in the last 168 hours. Cardiac Enzymes: No results for input(s): CKTOTAL, CKMB, CKMBINDEX, TROPONINI in the last 168 hours. BNP (last 3 results) No results for input(s): PROBNP in the last 8760 hours. HbA1C: No results for input(s): HGBA1C in the last 72 hours. CBG: Recent Labs  Lab 06/22/19 0801 06/22/19 1137 06/22/19 1653 06/22/19 2208 06/23/19 0745  GLUCAP 251* 243* 198* 192* 185*   Lipid Profile: No results for input(s): CHOL, HDL, LDLCALC, TRIG, CHOLHDL, LDLDIRECT in the last 72 hours. Thyroid Function Tests: No results for input(s): TSH, T4TOTAL, FREET4, T3FREE, THYROIDAB in the last 72 hours. Anemia Panel: No results for input(s): VITAMINB12, FOLATE, FERRITIN, TIBC, IRON, RETICCTPCT in the last 72 hours. Urine analysis:    Component Value Date/Time   COLORURINE AMBER (A) 06/19/2019 2343   APPEARANCEUR CLEAR 06/19/2019 2343   LABSPEC 1.009 06/19/2019 2343   PHURINE 5.0 06/19/2019 2343   GLUCOSEU NEGATIVE 06/19/2019 2343   HGBUR NEGATIVE 06/19/2019 2343   BILIRUBINUR NEGATIVE 06/19/2019 2343   KETONESUR NEGATIVE 06/19/2019 2343   PROTEINUR NEGATIVE 06/19/2019 2343   NITRITE NEGATIVE 06/19/2019 2343   LEUKOCYTESUR NEGATIVE 06/19/2019 2343   Sepsis Labs: @LABRCNTIP (procalcitonin:4,lacticacidven:4)  ) Recent Results (from the past 240 hour(s))  SARS CORONAVIRUS 2 (TAT 6-24 HRS) Nasopharyngeal Nasopharyngeal Swab     Status: None   Collection Time: 06/19/19 11:43 PM   Specimen:  Nasopharyngeal Swab  Result Value Ref Range Status   SARS Coronavirus 2 NEGATIVE NEGATIVE Final    Comment: (NOTE) SARS-CoV-2 target nucleic acids are NOT DETECTED. The SARS-CoV-2 RNA is generally detectable in upper and lower respiratory specimens during the acute phase of infection. Negative results do not preclude SARS-CoV-2 infection, do not rule out co-infections with other pathogens, and should not be used as the sole basis for treatment or other patient management decisions. Negative results must be combined with clinical observations, patient history, and epidemiological information. The expected result is Negative. Fact Sheet for Patients: SugarRoll.be Fact Sheet for Healthcare Providers: https://www.woods-mathews.com/ This test is not yet approved or cleared by the [place] FDA and  has been authorized for detection and/or diagnosis of SARS-CoV-2 by FDA under an Emergency Use Authorization (EUA). This EUA will remain  in effect (meaning this test can be used) for the duration of the COVID-19 declaration  under Section 56 4(b)(1) of the Act, 21 U.S.C. section 360bbb-3(b)(1), unless the authorization is terminated or revoked sooner. Performed at [facility], [place] [address] [place], [place] [address]          Radiology Studies: No results found.      Scheduled Meds:  atenolol  100 mg Oral Daily   insulin aspart  0-15 Units Subcutaneous TID WC   insulin detemir  14 Units Subcutaneous Daily   lisinopril  2.5 mg Oral Daily   Continuous Infusions:  ciprofloxacin 400 mg (06/22/19 2116)   metronidazole 500 mg (06/23/19 0823)     LOS: 3 days    Time spent: 25 minutes    Edwin Dada, MD [facility] 06/23/2019, 9:51 AM     Please page through [place]:  [email or web address] Password [email or web address] If 7PM-7AM, please contact night-coverage

## 2019-06-23 NOTE — H&P (View-Only) (Signed)
  No ERCP today. PLT inhibition ongoing per assay - will wait til 5 d off clopidogrel perf our usual which shoul be tomorrow.  Gatha Mayer, MD, [facility] 06/23/2019 6:12 AM Pager [phone]

## 2019-06-23 NOTE — Progress Notes (Signed)
  No ERCP today. PLT inhibition ongoing per assay - will wait til 5 d off clopidogrel perf our usual which shoul be tomorrow.  Gatha Mayer, MD, [facility] 06/23/2019 6:12 AM Pager [phone]

## 2019-06-24 ENCOUNTER — Inpatient Hospital Stay (HOSPITAL_COMMUNITY): Payer: BC Managed Care – PPO | Admitting: Anesthesiology

## 2019-06-24 ENCOUNTER — Encounter (HOSPITAL_COMMUNITY)
Admission: EM | Disposition: A | Discharge status: Home / Self Care | Payer: Self-pay | Source: Ambulatory Visit | Attending: Family Medicine

## 2019-06-24 ENCOUNTER — Inpatient Hospital Stay (HOSPITAL_COMMUNITY): Payer: BC Managed Care – PPO

## 2019-06-24 ENCOUNTER — Encounter (HOSPITAL_COMMUNITY): Payer: Self-pay

## 2019-06-24 HISTORY — PX: REMOVAL OF STONES: SHX5545

## 2019-06-24 HISTORY — PX: ERCP: SHX5425

## 2019-06-24 HISTORY — PX: SPHINCTEROTOMY: SHX5544

## 2019-06-24 LAB — COMPREHENSIVE METABOLIC PANEL
ALT: 129 U/L — ABNORMAL HIGH (ref 0–44)
AST: 66 U/L — ABNORMAL HIGH (ref 15–41)
Albumin: 3.2 g/dL — ABNORMAL LOW (ref 3.5–5.0)
Alkaline Phosphatase: 310 U/L — ABNORMAL HIGH (ref 38–126)
Anion gap: 10 (ref 5–15)
BUN: 17 mg/dL (ref 8–23)
CO2: 25 mmol/L (ref 22–32)
Calcium: 9.1 mg/dL (ref 8.9–10.3)
Chloride: 101 mmol/L (ref 98–111)
Creatinine, Ser: 0.86 mg/dL (ref 0.61–1.24)
GFR calc Af Amer: >60 mL/min (ref >60)
GFR calc non Af Amer: >60 mL/min (ref >60)
Glucose, Bld: 183 mg/dL — ABNORMAL HIGH (ref 70–99)
Potassium: 4.1 mmol/L (ref 3.5–5.1)
Sodium: 136 mmol/L (ref 135–145)
Total Bilirubin: 12 mg/dL — ABNORMAL HIGH (ref 0.3–1.2)
Total Protein: 6.9 g/dL (ref 6.5–8.1)

## 2019-06-24 LAB — CBC
HCT: 48.7 % (ref 39.0–52.0)
Hemoglobin: 16.4 g/dL (ref 13.0–17.0)
MCH: 30.9 pg (ref 26.0–34.0)
MCHC: 33.7 g/dL (ref 30.0–36.0)
MCV: 91.7 fL (ref 80.0–100.0)
Platelets: 201 10*3/uL (ref 150–400)
RBC: 5.31 MIL/uL (ref 4.22–5.81)
RDW: 15.2 % (ref 11.5–15.5)
WBC: 8.9 10*3/uL (ref 4.0–10.5)
nRBC: 0 % (ref 0.0–0.2)

## 2019-06-24 LAB — GLUCOSE, CAPILLARY
Glucose-Capillary: 204 mg/dL — ABNORMAL HIGH (ref 70–99)
Glucose-Capillary: 240 mg/dL — ABNORMAL HIGH (ref 70–99)
Glucose-Capillary: 234 mg/dL — ABNORMAL HIGH (ref 70–99)
Glucose-Capillary: 241 mg/dL — ABNORMAL HIGH (ref 70–99)
Glucose-Capillary: 153 mg/dL — ABNORMAL HIGH (ref 70–99)

## 2019-06-24 SURGERY — ERCP, WITH INTERVENTION IF INDICATED
Anesthesia: General

## 2019-06-24 MED ORDER — ONDANSETRON HCL 4 MG/2ML IJ SOLN
INTRAMUSCULAR | Status: DC | PRN
  Administered 2019-06-24: 4 mg via INTRAVENOUS

## 2019-06-24 MED ORDER — ESMOLOL HCL 100 MG/10ML IV SOLN
INTRAVENOUS | Status: DC | PRN
  Administered 2019-06-24 (×2): 20 mg via INTRAVENOUS

## 2019-06-24 MED ORDER — LIDOCAINE 2% (20 MG/ML) 5 ML SYRINGE
INTRAMUSCULAR | Status: DC | PRN
  Administered 2019-06-24: 60 mg via INTRAVENOUS

## 2019-06-24 MED ORDER — GLUCAGON HCL RDNA (DIAGNOSTIC) 1 MG IJ SOLR
INTRAMUSCULAR | Status: DC | PRN
  Administered 2019-06-24 (×2): .2 mL via INTRAVENOUS

## 2019-06-24 MED ORDER — FENTANYL CITRATE (PF) 100 MCG/2ML IJ SOLN
INTRAMUSCULAR | Status: DC | PRN
  Administered 2019-06-24 (×2): 50 ug via INTRAVENOUS

## 2019-06-24 MED ORDER — PROPOFOL 10 MG/ML IV BOLUS
INTRAVENOUS | Status: AC
  Filled 2019-06-24: qty 20

## 2019-06-24 MED ORDER — SUGAMMADEX SODIUM 200 MG/2ML IV SOLN
INTRAVENOUS | Status: DC | PRN
  Administered 2019-06-24: 200 mg via INTRAVENOUS

## 2019-06-24 MED ORDER — EPHEDRINE SULFATE-NACL 50-0.9 MG/10ML-% IV SOSY
PREFILLED_SYRINGE | INTRAVENOUS | Status: DC | PRN
  Administered 2019-06-24: 5 mg via INTRAVENOUS

## 2019-06-24 MED ORDER — SUCCINYLCHOLINE CHLORIDE 200 MG/10ML IV SOSY
PREFILLED_SYRINGE | INTRAVENOUS | Status: DC | PRN
  Administered 2019-06-24: 100 mg via INTRAVENOUS

## 2019-06-24 MED ORDER — GLUCAGON HCL RDNA (DIAGNOSTIC) 1 MG IJ SOLR
INTRAMUSCULAR | Status: AC
  Filled 2019-06-24: qty 1

## 2019-06-24 MED ORDER — LACTATED RINGERS IV SOLN
INTRAVENOUS | Status: DC
  Administered 2019-06-24: 1000 mL via INTRAVENOUS

## 2019-06-24 MED ORDER — SODIUM CHLORIDE 0.9 % IV SOLN
INTRAVENOUS | Status: DC | PRN
  Administered 2019-06-24: 30 mL

## 2019-06-24 MED ORDER — INDOMETHACIN 50 MG RE SUPP
RECTAL | Status: DC | PRN
  Administered 2019-06-24: 100 mg via RECTAL

## 2019-06-24 MED ORDER — ROCURONIUM BROMIDE 50 MG/5ML IV SOSY
PREFILLED_SYRINGE | INTRAVENOUS | Status: DC | PRN
  Administered 2019-06-24: 30 mg via INTRAVENOUS

## 2019-06-24 MED ORDER — FENTANYL CITRATE (PF) 100 MCG/2ML IJ SOLN
INTRAMUSCULAR | Status: AC
  Filled 2019-06-24: qty 2

## 2019-06-24 MED ORDER — PROPOFOL 10 MG/ML IV BOLUS
INTRAVENOUS | Status: DC | PRN
  Administered 2019-06-24: 200 mg via INTRAVENOUS

## 2019-06-24 MED ORDER — INDOMETHACIN 50 MG RE SUPP
RECTAL | Status: AC
  Filled 2019-06-24: qty 2

## 2019-06-24 MED ORDER — INDOMETHACIN 50 MG RE SUPP
100.0000 mg | Freq: Once | RECTAL | Status: AC
  Administered 2019-06-24: 100 mg via RECTAL
  Filled 2019-06-24: qty 2

## 2019-06-24 NOTE — Op Note (Addendum)
[facility] Patient Name: Ricky Cabrera Procedure Date: 06/24/2019 MRN: [id number] Attending MD: Jackquline Denmark , MD Date of Birth: 02/05/1956 CSN: [id number] Age: 63 Admit Type: Inpatient Procedure:                ERCP Indications:              Bile duct stone(s) on CT with obstructive jaundice,                            ascending cholangitis. History of cholecystectomy. Providers:                Jackquline Denmark, MD, Cleda Daub, RN, William Dalton, Technician Referring MD:              Medicines:                General Anesthesia, IV Cipro, glucagon 0.4 mg,                            indomethacin suppositories. Complications:            No immediate complications. Mild oozing had stopped                            towards the end of the procedure not requiring any                            treatment. Estimated Blood Loss:     Estimated blood loss: none. Procedure:                Pre-Anesthesia Assessment:                           - Prior to the procedure, a History and Physical                            was performed, and patient medications and                            allergies were reviewed. The patient's tolerance of                            previous anesthesia was also reviewed. The risks                            and benefits of the procedure and the sedation                            options and risks were discussed with the patient.                            All questions were answered, and informed consent                            was  obtained. Prior Anticoagulants: The patient has                            taken Plavix (clopidogrel), last dose was 5 days                            prior to procedure. ASA Grade Assessment: III - A                            patient with severe systemic disease. After                            reviewing the risks and benefits, the patient was                            deemed in  satisfactory condition to undergo the                            procedure.                           After obtaining informed consent, the scope was                            passed under direct vision. Throughout the                            procedure, the patient's blood pressure, pulse, and                            oxygen saturations were monitored continuously. The                            [id number] Olympus duodenoscope was                            introduced through the mouth, and used to inject                            contrast into and used to inject contrast into the                            bile duct. The ERCP was accomplished without                            difficulty. The patient tolerated the procedure                            well. Scope In: Scope Out: Findings:      A scout film of the abdomen was obtained. Surgical clips, consistent       with a previous cholecystectomy, were seen in the area of the right       upper quadrant of the abdomen. The esophagus was successfully intubated  under direct vision. The scope was advanced to a normal major papilla in       the descending duodenum without detailed examination of the pharynx,       larynx and associated structures, and upper GI tract with some       difficulty since stomach was J-shaped.      A small periampullary diverticulum was noted. The major papilla       otherwise was bulging but normal. The bile duct was deeply cannulated       with the short-nosed traction sphincterotome. Contrast was injected. I       personally interpreted the bile duct images. The CBD was dilated to 15       mm with multiple filling defects consistent with stones. The right and       the left hepatic ducts were also dilated. An 8 mm biliary sphincterotomy       was made with a monofilament traction (standard) sphincterotome using       ERBE electrocautery. There was self limited oozing from the        sphincterotomy which did not require treatment. Sphincteroplasty was       performed using a 06-30-11 mm balloon to a maximum balloon size of 12       mm. The biliary tree was swept with a 15 mm balloon starting at the       bifurcation. Multiple large stones (largest 1.5 cm) and sludge were       removed. There was no pus. Balloon was trolled several times in and out       of the common bile duct. Thereafter, postocclusion cholangiogram did not       reveal any residual filling defects. The cystic duct remnant did fill.      Pancreatic duct was intentionally not cannulated. Impression:               - Choledocholithiasis s/p biliary sphincterotomy,                            sphinteroplasty and balloon extraction. Moderate Sedation:      Not Applicable - Patient had care per Anesthesia. Recommendation:           -Watch for pancreatitis, bleeding, perforation, and                            cholangitis, particularly bleeding..                           -Clear liquid diet by 3 PM today.                           -I have discussed above with patient's wife. Since                            the bile duct was significantly dilated and patient                            had H/O ascending cholangitis and sludge, he is at                            risk of having primary choledocholithiasis  in the                            future.                           -Indomethacin suppositories were given.                           -Would recommend resuming Plavix in 7 days.                           -Continue antibiotics x 5 more days. Procedure Code(s):        --- Professional ---                           [id number], 52, Endoscopic retrograde                            cholangiopancreatography (ERCP); with                            trans-endoscopic balloon dilation of                            biliary/pancreatic duct(s) or of ampulla                            (sphincteroplasty), including sphincterotomy,  when                            performed, each duct                           [id number], Endoscopic retrograde                            cholangiopancreatography (ERCP); with removal of                            calculi/debris from biliary/pancreatic duct(s)                           [id number], Endoscopic catheterization of the biliary                            ductal system, radiological supervision and                            interpretation Diagnosis Code(s):        --- Professional ---                           K80.50, Calculus of bile duct without cholangitis                            or cholecystitis without obstruction  R93.2, Abnormal findings on diagnostic imaging of                            liver and biliary tract CPT copyright 2019 American [employer] Association. All rights reserved. The codes documented in this report are preliminary and upon coder review may  be revised to meet current compliance requirements. Jackquline Denmark, MD 06/24/2019 11:06:13 AM This report has been signed electronically. Number of Addenda: 0

## 2019-06-24 NOTE — Anesthesia Procedure Notes (Signed)
Procedure Name: Intubation Date/Time: 06/24/2019 9:38 AM Performed by: West Pugh, CRNA Pre-anesthesia Checklist: Patient identified, Emergency Drugs available, Suction available, Patient being monitored and Timeout performed Patient Re-evaluated:Patient Re-evaluated prior to induction Oxygen Delivery Method: Circle system utilized Preoxygenation: Pre-oxygenation with 100% oxygen Induction Type: IV induction, Rapid sequence and Cricoid Pressure applied Laryngoscope Size: Mac and 3 Grade View: Grade II Tube type: Oral Tube size: 7.0 mm Number of attempts: 1 Airway Equipment and Method: Stylet Placement Confirmation: ETT inserted through vocal cords under direct vision,  positive ETCO2,  CO2 detector and breath sounds checked- equal and bilateral Tube secured with: Tape Dental Injury: Teeth and Oropharynx as per pre-operative assessment  Comments: AOI

## 2019-06-24 NOTE — Progress Notes (Signed)
PROGRESS NOTE    Ricky Cabrera  [id number] DOB: 04/28/1956 DOA: 06/19/2019 PCP: Antonietta Jewel, MD   Brief Narrative: As per chart: 63 y.o. M with CAD s/p PCI 2015, DM, HTN, and choledocholithiasis in 2018 who presented with few months intermittent epigastric pain/vomiting and now two weeks worsening pain as well as jaundice.    Seen in outpatient GI office day of admission, noted to have transaminitis, sent for admission. In ER, AST/ALT 96/178, CT showed choledocholithiasis with possilbe superimposed cholangitis. 10/5-  Subjective: Seen this morning post procedure resting comfortably.  Denies fever chills nausea vomiting or abdominal pain.  Assessment & Plan:  Choledocholithiasis with transaminitis: Status post ERCP, see report below.  Patient history of ascending cholangitis, GI advises to continue on antibiotics for x5 days.  Continue Cipro and Flagyl IV while here, clear liquid diet tonight, repeat LFTs and CBC in the morning. ERCP report: "Impression: Choledocholithiasis s/p biliary sphincterotomy and balloon extraction. -Recommendations: Watch for pancreatitis, bleeding, perforation, and cholangitis, particularly bleeding.. -Clear liquid diet by 6 PM today. -I have discussed above with patient's wife. Since the bile duct was significantly dilated and patient had history of ascending cholangitis and sludge, he is at risk of having primary choledocholithiasis in the future as well. -Indomethacin suppositories were given. -Would recommend resuming Plavix in 7 days. -Continue antibiotics x 5 more days"  CAD: No chest pain.  Stable.  Plavix held preprocedure last PCI 2015.  Resume once okay with GI, monitoring at this time for risk of bleeding continue atenolol.  Lisinopril.  Diabetes mellitus: Blood sugar borderline controlled,  continue sliding scale insulin, Levemir 14 units daily.  A1c 9.3 on 9/30.  Hypertension associated with diabetes:BP stable.  On atenolol and lisinopril.   Hyponatremia: Mild resolved.  DVT prophylaxis: SCD. Code Status: full Family Communication: plan of care discussed with patient in detail. Disposition Plan: Remains inpatient pending signing off from consultant, monitoring LFTs/signs of cholangitis/decompensation post procedure.  Consultants: GI  Procedures: ERCP w biliary sphincterotomy and balloon extraction 10/5  Microbiology: COVID 19 neg  Antimicrobials: Anti-infectives (From admission, onward)   Start     Dose/Rate Route Frequency Ordered Stop   06/22/19 2200  ciprofloxacin (CIPRO) IVPB 400 mg     400 mg 200 mL/hr over 60 Minutes Intravenous Every 12 hours 06/22/19 1052     06/19/19 2359  metroNIDAZOLE (FLAGYL) IVPB 500 mg     500 mg 100 mL/hr over 60 Minutes Intravenous Every 8 hours 06/19/19 2308     06/19/19 2315  ciprofloxacin (CIPRO) IVPB 400 mg  Status:  Discontinued     400 mg 200 mL/hr over 60 Minutes Intravenous Every 12 hours 06/19/19 2308 06/22/19 1051       Objective: Vitals:   06/24/19 1139 06/24/19 1233 06/24/19 1329 06/24/19 1426  BP: 136/88 140/82 122/73 127/74  Pulse: 70 65 (!) 57 (!) 51  Resp: 18 16  18   Temp: 98.6 F (37 C) 98 F (36.7 C) 98 F (36.7 C) 98 F (36.7 C)  TempSrc: Oral Oral Oral Oral  SpO2: 97% 96% 98% 98%  Weight:      Height:        Intake/Output Summary (Last 24 hours) at 06/24/2019 1502 Last data filed at 06/24/2019 1311 Gross per 24 hour  Intake 1961.5 ml  Output 0 ml  Net 1961.5 ml   Filed Weights   06/22/19 0500 06/23/19 0415 06/24/19 0608  Weight: 93.3 kg 93.6 kg 93 kg   Weight change: -0.6 kg  Body mass index is 26.32 kg/m.  Intake/Output from previous day: 10/04 0701 - 10/05 0700 In: 49 [P.O.:880; IV Piggyback:500] Out: 0  Intake/Output this shift: Total I/O In: 1461.5 [P.O.:240; I.V.:1021.5; IV Piggyback:200] Out: -   Examination:  General exam: Appears calm and comfortable,Not in distress, elderly, HEENT:PERRL,Oral mucosa moist, Ear/Nose  normal on gross exam Respiratory system: Bilateral equal air entry, normal vesicular breath sounds, no wheezes or crackles  Cardiovascular system: S1 & S2 heard,No JVD, murmurs. Gastrointestinal system: Abdomen is  soft, non tender, non distended, BS +  Nervous System:Alert and oriented. No focal neurological deficits/moving extremities, sensation intact. Extremities: No edema, no clubbing, distal peripheral pulses palpable. Skin: No rashes, lesions, no icterus MSK: Normal muscle bulk,tone ,power  Medications:  Scheduled Meds: . atenolol  100 mg Oral Daily  . insulin aspart  0-15 Units Subcutaneous TID WC  . insulin detemir  14 Units Subcutaneous Daily  . lisinopril  2.5 mg Oral Daily   Continuous Infusions: . ciprofloxacin 400 mg (06/24/19 1157)  . metronidazole 500 mg (06/24/19 0858)    Data Reviewed: I have personally reviewed following labs and imaging studies  CBC: Recent Labs  Lab 06/19/19 1024 06/19/19 1704 06/21/19 0619 06/23/19 0251 06/24/19 0324  WBC 12.6* 11.8* 7.7 8.4 8.9  NEUTROABS 9.5*  --   --   --   --   HGB 17.8* 17.6* 16.1 15.7 16.4  HCT 53.0* 53.1* 49.0 46.7 48.7  MCV 92.1 92.8 94.0 91.9 91.7  PLT 188.0 185 164 188 [id number]   Basic Metabolic Panel: Recent Labs  Lab 06/20/19 0350 06/21/19 0619 06/22/19 0351 06/23/19 0251 06/24/19 0324  NA 133* 133* 135 136 136  K 4.4 4.6 4.1 4.5 4.1  CL 98 101 102 101 101  CO2 27 24 26 27 25   GLUCOSE 213* 203* 226* 158* 183*  BUN 19 16 18 17 17   CREATININE 0.94 0.74 1.10 0.75 0.86  CALCIUM 8.7* 9.0 8.7* 8.9 9.1   GFR: Estimated Creatinine Clearance: 104.9 mL/min (by C-G formula based on SCr of 0.86 mg/dL). Liver Function Tests: Recent Labs  Lab 06/20/19 0350 06/21/19 0619 06/22/19 0351 06/23/19 0251 06/24/19 0324  AST 99* 96* 76* 74* 66*  ALT 163* 167* 149* 137* 129*  ALKPHOS 304* 324* 309* 308* 310*  BILITOT 10.4* 12.5* 10.9* 12.5* 12.0*  PROT 6.5 6.0* 6.3* 6.4* 6.9  ALBUMIN 3.1* 3.0* 3.0* 3.0* 3.2*    Recent Labs  Lab 06/19/19 1704  LIPASE 28   No results for input(s): AMMONIA in the last 168 hours. Coagulation Profile: No results for input(s): INR, PROTIME in the last 168 hours. Cardiac Enzymes: No results for input(s): CKTOTAL, CKMB, CKMBINDEX, TROPONINI in the last 168 hours. BNP (last 3 results) No results for input(s): PROBNP in the last 8760 hours. HbA1C: No results for input(s): HGBA1C in the last 72 hours. CBG: Recent Labs  Lab 06/23/19 1734 06/23/19 2142 06/24/19 0744 06/24/19 1106 06/24/19 1157  GLUCAP 206* 213* 204* 240* 234*   Lipid Profile: No results for input(s): CHOL, HDL, LDLCALC, TRIG, CHOLHDL, LDLDIRECT in the last 72 hours. Thyroid Function Tests: No results for input(s): TSH, T4TOTAL, FREET4, T3FREE, THYROIDAB in the last 72 hours. Anemia Panel: No results for input(s): VITAMINB12, FOLATE, FERRITIN, TIBC, IRON, RETICCTPCT in the last 72 hours. Sepsis Labs: No results for input(s): PROCALCITON, LATICACIDVEN in the last 168 hours.  Recent Results (from the past 240 hour(s))  SARS CORONAVIRUS 2 (TAT 6-24 HRS) Nasopharyngeal Nasopharyngeal Swab  Status: None   Collection Time: 06/19/19 11:43 PM   Specimen: Nasopharyngeal Swab  Result Value Ref Range Status   SARS Coronavirus 2 NEGATIVE NEGATIVE Final    Comment: (NOTE) SARS-CoV-2 target nucleic acids are NOT DETECTED. The SARS-CoV-2 RNA is generally detectable in upper and lower respiratory specimens during the acute phase of infection. Negative results do not preclude SARS-CoV-2 infection, do not rule out co-infections with other pathogens, and should not be used as the sole basis for treatment or other patient management decisions. Negative results must be combined with clinical observations, patient history, and epidemiological information. The expected result is Negative. Fact Sheet for Patients: SugarRoll.be Fact Sheet for Healthcare Providers:  https://www.woods-mathews.com/ This test is not yet approved or cleared by the [place] FDA and  has been authorized for detection and/or diagnosis of SARS-CoV-2 by FDA under an Emergency Use Authorization (EUA). This EUA will remain  in effect (meaning this test can be used) for the duration of the COVID-19 declaration under Section 56 4(b)(1) of the Act, 21 U.S.C. section 360bbb-3(b)(1), unless the authorization is terminated or revoked sooner. Performed at [facility], [place] [address], [place], [place] [address]       Radiology Studies: Dg Ercp Biliary & Pancreatic Ducts  Result Date: 06/24/2019 CLINICAL DATA:  Sphincterotomy with stone removal EXAM: ERCP TECHNIQUE: Multiple spot images obtained with the fluoroscopic device and submitted for interpretation post-procedure. FLUOROSCOPY TIME:  Fluoroscopy Time:  3 minutes and 36 seconds Radiation Exposure Index (if provided by the fluoroscopic device): 116 Number of Acquired Spot Images: 9 COMPARISON:  CT dated 06/18/2001 FINDINGS: The patient has undergone ERCP with balloon sweep of the common bile duct. Final images demonstrate no convincing filling defect within the common bile duct. IMPRESSION: Status post ERCP as above. These images were submitted for radiologic interpretation only. Please see the procedural report for the amount of contrast and the fluoroscopy time utilized. Electronically Signed   By: Constance Holster M.D.   On: 06/24/2019 12:02      LOS: 4 days   Time spent: More than 50% of that time was spent in counseling and/or coordination of care.  Antonieta Pert, MD [facility]  06/24/2019, 3:02 PM

## 2019-06-24 NOTE — Anesthesia Postprocedure Evaluation (Signed)
Anesthesia Post Note  Patient: Ricky Cabrera  Procedure(s) Performed: ENDOSCOPIC RETROGRADE CHOLANGIOPANCREATOGRAPHY (ERCP) (N/A ) SPHINCTEROTOMY REMOVAL OF STONES BILIARY DILITATION (N/A )     Patient location during evaluation: Endoscopy Anesthesia Type: General Level of consciousness: awake and alert Pain management: pain level controlled Vital Signs Assessment: post-procedure vital signs reviewed and stable Respiratory status: spontaneous breathing, nonlabored ventilation, respiratory function stable and patient connected to nasal cannula oxygen Cardiovascular status: blood pressure returned to baseline and stable Postop Assessment: no apparent nausea or vomiting Anesthetic complications: no    Last Vitals:  Vitals:   06/24/19 1110 06/24/19 1115  BP: (!) 147/76   Pulse: 73 73  Resp: (!) 25 18  Temp:    SpO2: 100% 99%    Last Pain:  Vitals:   06/24/19 1115  TempSrc:   PainSc: 0-No pain                 Barnet Glasgow

## 2019-06-24 NOTE — Interval H&P Note (Signed)
History and Physical Interval Note:  06/24/2019 9:24 AM  Ricky Cabrera  has presented today for surgery, with the diagnosis of CBD stone.  The various methods of treatment have been discussed with the patient and family. After consideration of risks, benefits and other options for treatment, the patient has consented to  Procedure(s): ENDOSCOPIC RETROGRADE CHOLANGIOPANCREATOGRAPHY (ERCP) (N/A) as a surgical intervention.  The patient's history has been reviewed, patient examined, no change in status, stable for surgery.  I have reviewed the patient's chart and labs.  Questions were answered to the patient's satisfaction.     Jackquline Denmark

## 2019-06-24 NOTE — Transfer of Care (Signed)
Immediate Anesthesia Transfer of Care Note  Patient: Ricky Cabrera  Procedure(s) Performed: ENDOSCOPIC RETROGRADE CHOLANGIOPANCREATOGRAPHY (ERCP) (N/A ) SPHINCTEROTOMY REMOVAL OF STONES BILIARY DILITATION (N/A )  Patient Location: PACU and Endoscopy Unit  Anesthesia Type:General  Level of Consciousness: awake, alert , oriented and patient cooperative  Airway & Oxygen Therapy: Patient Spontanous Breathing and Patient connected to face mask oxygen  Post-op Assessment: Report given to RN and Post -op Vital signs reviewed and stable  Post vital signs: Reviewed and stable  Last Vitals:  Vitals Value Taken Time  BP 151/76 06/24/19 1100  Temp 36.1 C 06/24/19 1100  Pulse 76 06/24/19 1103  Resp 17 06/24/19 1103  SpO2 100 % 06/24/19 1103  Vitals shown include unvalidated device data.  Last Pain:  Vitals:   06/24/19 1100  TempSrc: Oral  PainSc: 0-No pain      Patients Stated Pain Goal: 2 ([id number])  Complications: No apparent anesthesia complications

## 2019-06-24 NOTE — Anesthesia Preprocedure Evaluation (Addendum)
Anesthesia Evaluation  Patient identified by MRN, date of birth, ID band Patient awake    Reviewed: Allergy & Precautions, NPO status , Patient's Chart, lab work & pertinent test results  Airway Mallampati: II  TM Distance: >3 FB Neck ROM: Full    Dental no notable dental hx. (+) Dental Advisory Given, Poor Dentition,    Pulmonary neg pulmonary ROS, former smoker,    Pulmonary exam normal breath sounds clear to auscultation       Cardiovascular Exercise Tolerance: Good hypertension, Pt. on medications and Pt. on home beta blockers + CAD  Normal cardiovascular exam Rhythm:Regular Rate:Normal     Neuro/Psych negative neurological ROS  negative psych ROS   GI/Hepatic negative GI ROS, Neg liver ROS,   Endo/Other  diabetes, Well Controlled, Type 2, Insulin DependentHgb A1C 7.3  Renal/GU      Musculoskeletal  (+) Arthritis ,   Abdominal   Peds  Hematology negative hematology ROS (+) Hgb 16.4   Anesthesia Other Findings   Reproductive/Obstetrics                           Anesthesia Physical Anesthesia Plan  ASA: III  Anesthesia Plan: General   Post-op Pain Management:    Induction: Intravenous  PONV Risk Score and Plan: 3 and 0 and Treatment may vary due to age or medical condition, Ondansetron, Dexamethasone and Midazolam  Airway Management Planned: Oral ETT  Additional Equipment: None  Intra-op Plan:   Post-operative Plan: Extubation in OR  Informed Consent: I have reviewed the patients History and Physical, chart, labs and discussed the procedure including the risks, benefits and alternatives for the proposed anesthesia with the patient or authorized representative who has indicated his/her understanding and acceptance.     Dental advisory given  Plan Discussed with: CRNA  Anesthesia Plan Comments: (Pt on Plavix last dose 9/30 Pt w Suspected CBD stone for ERCP)        Anesthesia Quick Evaluation

## 2019-06-25 ENCOUNTER — Encounter (HOSPITAL_COMMUNITY): Payer: Self-pay | Admitting: Gastroenterology

## 2019-06-25 ENCOUNTER — Other Ambulatory Visit: Payer: Self-pay

## 2019-06-25 DIAGNOSIS — R7989 Other specified abnormal findings of blood chemistry: Secondary | ICD-10-CM

## 2019-06-25 LAB — CBC
HCT: 46.4 % (ref 39.0–52.0)
Hemoglobin: 15.9 g/dL (ref 13.0–17.0)
MCH: 31.1 pg (ref 26.0–34.0)
MCHC: 34.3 g/dL (ref 30.0–36.0)
MCV: 90.8 fL (ref 80.0–100.0)
Platelets: 178 10*3/uL (ref 150–400)
RBC: 5.11 MIL/uL (ref 4.22–5.81)
RDW: 15.2 % (ref 11.5–15.5)
WBC: 8.8 10*3/uL (ref 4.0–10.5)
nRBC: 0 % (ref 0.0–0.2)

## 2019-06-25 LAB — COMPREHENSIVE METABOLIC PANEL
ALT: 95 U/L — ABNORMAL HIGH (ref 0–44)
AST: 46 U/L — ABNORMAL HIGH (ref 15–41)
Albumin: 3 g/dL — ABNORMAL LOW (ref 3.5–5.0)
Alkaline Phosphatase: 275 U/L — ABNORMAL HIGH (ref 38–126)
Anion gap: 10 (ref 5–15)
BUN: 19 mg/dL (ref 8–23)
CO2: 25 mmol/L (ref 22–32)
Calcium: 8.7 mg/dL — ABNORMAL LOW (ref 8.9–10.3)
Chloride: 101 mmol/L (ref 98–111)
Creatinine, Ser: 1.03 mg/dL (ref 0.61–1.24)
GFR calc Af Amer: >60 mL/min (ref >60)
GFR calc non Af Amer: >60 mL/min (ref >60)
Glucose, Bld: 146 mg/dL — ABNORMAL HIGH (ref 70–99)
Potassium: 4.2 mmol/L (ref 3.5–5.1)
Sodium: 136 mmol/L (ref 135–145)
Total Bilirubin: 8.1 mg/dL — ABNORMAL HIGH (ref 0.3–1.2)
Total Protein: 6.3 g/dL — ABNORMAL LOW (ref 6.5–8.1)

## 2019-06-25 LAB — PROTIME-INR
INR: 1.1 (ref 0.8–1.2)
Prothrombin Time: 14 seconds (ref 11.4–15.2)

## 2019-06-25 LAB — GLUCOSE, CAPILLARY
Glucose-Capillary: 151 mg/dL — ABNORMAL HIGH (ref 70–99)
Glucose-Capillary: 193 mg/dL — ABNORMAL HIGH (ref 70–99)

## 2019-06-25 MED ORDER — INSULIN DETEMIR 100 UNIT/ML ~~LOC~~ SOLN: 25.0000 [IU] | Freq: Every day | SUBCUTANEOUS | 11 refills | Status: AC

## 2019-06-25 MED ORDER — METRONIDAZOLE 500 MG PO TABS: 500.0000 mg | ORAL_TABLET | Freq: Three times a day (TID) | ORAL | 0 refills | Status: AC

## 2019-06-25 MED ORDER — POLYETHYLENE GLYCOL 3350 17 G PO PACK: 17.0000 g | PACK | Freq: Every day | ORAL | 0 refills | Status: DC | PRN

## 2019-06-25 MED ORDER — SENNOSIDES-DOCUSATE SODIUM 8.6-50 MG PO TABS: 1.0000 | ORAL_TABLET | Freq: Every evening | ORAL | 0 refills | Status: AC | PRN

## 2019-06-25 MED ORDER — HYDROXYZINE HCL 25 MG PO TABS: 25.0000 mg | ORAL_TABLET | Freq: Three times a day (TID) | ORAL | 0 refills | Status: DC | PRN

## 2019-06-25 MED ORDER — CLOPIDOGREL BISULFATE 75 MG PO TABS: 75.0000 mg | ORAL_TABLET | Freq: Every day | ORAL | Status: AC

## 2019-06-25 MED ORDER — CIPROFLOXACIN HCL 500 MG PO TABS: 500.0000 mg | ORAL_TABLET | Freq: Two times a day (BID) | ORAL | 0 refills | Status: AC

## 2019-06-25 NOTE — Discharge Instructions (Signed)
Please come to get labs drawn in the [facility] building, basement level, in 2 weeks.

## 2019-06-25 NOTE — Progress Notes (Addendum)
Gastroenterology Progress Note  CC:  CBD stones and cholangitis  Subjective:  Feels great.  Ready to go home.  No complaints.  Objective:  Vital signs in last 24 hours: Temp:  [96.9 F (36.1 C)-98.8 F (37.1 C)] 98.2 F (36.8 C) (10/06 0517) Pulse Rate:  [51-79] 59 (10/06 0517) Resp:  [14-30] 18 (10/06 0517) BP: (118-151)/(68-88) 128/72 (10/06 0517) SpO2:  [93 %-100 %] 95 % (10/06 0517) Weight:  [94.3 kg] 94.3 kg (10/06 0500) Last BM Date: 06/21/19 General:  Alert, Well-developed, in NAD Heart:  Regular rate and rhythm; no murmurs Pulm:  CTAB.  No increased WOB. Abdomen:  Soft, non-distended.  BS present.  Non-tender.  Extremities:  Without edema. Neurologic:  Alert and oriented x 4;  grossly normal neurologically. Psych:  Alert and cooperative. Normal mood and affect.  Intake/Output from previous day: 10/05 0701 - 10/06 0700 In: 3441.4 [P.O.:1920; I.V.:1021.5; IV Piggyback:499.9] Out: 0   Lab Results: Recent Labs    06/23/19 0251 06/24/19 0324 06/25/19 0335  WBC 8.4 8.9 8.8  HGB 15.7 16.4 15.9  HCT 46.7 48.7 46.4  PLT 188 201 178   BMET Recent Labs    06/23/19 0251 06/24/19 0324 06/25/19 0335  NA 136 136 136  K 4.5 4.1 4.2  CL 101 101 101  CO2 27 25 25   GLUCOSE 158* 183* 146*  BUN 17 17 19   CREATININE 0.75 0.86 1.03  CALCIUM 8.9 9.1 8.7*   LFT Recent Labs    06/25/19 0335  PROT 6.3*  ALBUMIN 3.0*  AST 46*  ALT 95*  ALKPHOS 275*  BILITOT 8.1*   PT/INR Recent Labs    06/25/19 0842  LABPROT 14.0  INR 1.1   Dg Ercp Biliary & Pancreatic Ducts  Result Date: 06/24/2019 CLINICAL DATA:  Sphincterotomy with stone removal EXAM: ERCP TECHNIQUE: Multiple spot images obtained with the fluoroscopic device and submitted for interpretation post-procedure. FLUOROSCOPY TIME:  Fluoroscopy Time:  3 minutes and 36 seconds Radiation Exposure Index (if provided by the fluoroscopic device): 116 Number of Acquired Spot Images: 9 COMPARISON:  CT  dated 06/18/2001 FINDINGS: The patient has undergone ERCP with balloon sweep of the common bile duct. Final images demonstrate no convincing filling defect within the common bile duct. IMPRESSION: Status post ERCP as above. These images were submitted for radiologic interpretation only. Please see the procedural report for the amount of contrast and the fluoroscopy time utilized. Electronically Signed   By: Constance Holster M.D.   On: 06/24/2019 12:02   Assessment / Plan: *Choledocholithiasis/cholangitis:  S/p ERCP with sphincterotomy, sphincteroplasty, and balloon extraction on 10/5.  Doing well.  LFT's trending down.  Would continue 4 more days of abx.  Ok to resume Plavix on 10/13.  [place] for discharge today.  Will check CBC and CMP in 2 weeks (orders already in) and will follow-up with Dr. Loletha Carrow thereafter as well--will need to schedule routine colonoscopy at that time as well (appt details in discharge info).  Low fat diet for now.   LOS: 5 days   Laban Emperor. Zehr  06/25/2019, 9:29 AM    Attending physician's note   I have taken an interval history, reviewed the chart and examined the patient. I agree with the Advanced Practitioner's note, impression and recommendations.   Patient without any abdominal pain.  Liver function tests including bilirubin trending down.  Plan: -Continue Cipro x 4 more days. -Resume Plavix 10/13 -Check CBC/CMP in 2 weeks -FU GI in 4 weeks. -  Call if there is any melena since he is at higher risk for delayed post sphincterotomy bleeding.  Carmell Austria, MD [facility] [phone].

## 2019-06-25 NOTE — Progress Notes (Signed)
Discharge instructions given to pt and all questions were answered.  

## 2019-06-25 NOTE — Discharge Summary (Signed)
Physician Discharge Summary  Ricky Cabrera [id number] DOB: Mar 26, 1956 DOA: 06/19/2019  PCP: Antonietta Jewel, MD  Admit date: 06/19/2019 Discharge date: 06/25/2019  Time spent: 25 minutes  Recommendations for Outpatient Follow-up:  1. Complete course of antibiotics 2. Resumption of Plavix on 10/13 3. Needs Chem-12, CBC in about 1 month 4. Outpatient follow-up with Dr. 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, HTN, choledocholithiasis 2018 status post lap chole admit 0000000 complicated by the fact that patient is on Plavix and needed this to washout   Hospital Course:  Choledocho lithiasis and possible ascending cholangitis on admission-patient started on Cipro Flagyl IV leukocytosis got better and he was transitioned on discharge after ERCP and balloon stone extraction to oral antibiotics-he should complete oral antibiotics as per GI input in about 5 days scripts have been called in Plavix should be resumed on around 10/12 or 10/13 Diet was graduated by GI to soft diet and he was able to tolerate this on discharge DM TY 2 A1c 9.3-is only on long-acting insulin-may need consideration for outpatient short acting insulin in addition as he was taking high doses of 50 to 100 units of Levemir HTN-continued atenolol and lisinopril  Procedures: Per Dr. Lyndel Safe of GI on 10/5 Impression: Choledocholithiasis s/p biliary sphincterotomy and balloon extraction. -Recommendations: Watch for pancreatitis,  bleeding, perforation, and cholangitis, particularly bleeding.. -Clear liquid diet by 6 PM today. -I have discussed above with patient's wife. Since the bile duct was significantly dilated and patient had history of ascending cholangitis and sludge, he is at risk of having primary choledocholithiasis in the future as well. -Indomethacin suppositories were given. -Would recommend resuming Plavix in 7 days.  -Continue antibiotics x 5 more days   Consultations:  Gastroenterology  Discharge Exam: Vitals:   06/25/19 0226 06/25/19 0517  BP:  128/72  Pulse:  (!) 59  Resp: 18 18  Temp:  98.2 F (36.8 C)  SpO2:  95%    General: Awake alert coherent no distress EOMI NCAT Cardiovascular: S1-S2 no murmur rub or gallop Respiratory: Rales no rhonchi abdomen soft nontender no rebound No lower extremity edema  Discharge Instructions   Discharge Instructions    Diet - low sodium heart healthy   Complete by: As directed    Discharge instructions   Complete by: As directed    Make sure that you finish all of the antibiotics that you are prescribed and also do not take Plavix until 10/13 which is 7 days from your procedure Note that your insulin doses has changed to some degree and we have decreased it to 25 units at night-I would recommend that as your blood sugars increase as you are on a soft diet that you take increasing amounts of insulin and get labs checked in about a week or so I would also recommend that you follow-up with your gastroenterologist as per his discretion but get labs at your primary care physician's office in about 1 week or so   Increase activity slowly  Complete by: As directed      Allergies as of 06/25/2019      Reactions   Penicillins Rash      Medication List    TAKE these medications   atenolol 100 MG tablet Commonly known as: TENORMIN Take 100 mg by mouth daily.   ciprofloxacin 500 MG tablet Commonly known as: CIPRO Take 1 tablet (500 mg total) by  mouth 2 (two) times daily for 5 days.   clopidogrel 75 MG tablet Commonly known as: PLAVIX Take 1 tablet (75 mg total) by mouth daily. Start taking on: July 02, 2019 What changed: These instructions start on July 02, 2019. If you are unsure what to do until then, ask your doctor or other care provider.   docusate sodium 50 MG capsule Commonly known as: COLACE Take 100 mg by mouth 3 (three) times daily.   furosemide 20 MG tablet Commonly known as: LASIX Take 20 mg by mouth every morning.   hydrOXYzine 25 MG tablet Commonly known as: ATARAX/VISTARIL Take 1 tablet (25 mg total) by mouth 3 (three) times daily as needed for itching.   insulin detemir 100 UNIT/ML injection Commonly known as: LEVEMIR Inject 0.25 mLs (25 Units total) into the skin daily. What changed: how much to take   lisinopril 2.5 MG tablet Commonly known as: ZESTRIL Take 2.5 mg by mouth daily.   metroNIDAZOLE 500 MG tablet Commonly known as: Flagyl Take 1 tablet (500 mg total) by mouth 3 (three) times daily for 5 days.   Oxycodone HCl 10 MG Tabs Take 1 tablet by mouth 3 (three) times daily.   polyethylene glycol 17 g packet Commonly known as: MIRALAX / GLYCOLAX Take 17 g by mouth daily as needed for mild constipation.   senna-docusate 8.6-50 MG tablet Commonly known as: Senokot-S Take 1 tablet by mouth at bedtime as needed for mild constipation.   sildenafil 100 MG tablet Commonly known as: VIAGRA Take 100 mg by mouth every other day.   Vitamin D (Ergocalciferol) 1.25 MG (50000 UT) Caps capsule Commonly known as: DRISDOL Take 50,000 Units by mouth every 7 (seven) days.      Allergies  Allergen Reactions  . Penicillins Rash      The results of significant diagnostics from this hospitalization (including imaging, microbiology, ancillary and laboratory) are listed below for reference.    Significant Diagnostic Studies: Ct Abdomen Pelvis W Contrast  Result Date: 06/19/2019 CLINICAL  DATA:  63 year old male with known cholangitis presenting for follow-up. EXAM: CT ABDOMEN AND PELVIS WITH CONTRAST TECHNIQUE: Multidetector CT imaging of the abdomen and pelvis was performed using the standard protocol following bolus administration of intravenous contrast. CONTRAST:  136mL OMNIPAQUE IOHEXOL 300 MG/ML  SOLN COMPARISON:  Abdominal ultrasound dated 01/12/2017 FINDINGS: Lower chest: The visualized lung bases are clear. Advanced multi vessel coronary vascular calcification noted. Intra-abdominal free air or free fluid. Hepatobiliary: Apparent fatty infiltration of the liver. There is dilatation of the intrahepatic biliary tree as well as common bile duct. Several noncalcified adjacent stones noted in the central CBD with a combined length of 18 mm (coronal series 5 image 76). The common bile duct measures approximately 11 mm in diameter. Mild haziness of the fat surrounding the biliary tree concerning for superimposed infection/cholangitis. Further evaluation with MRCP is recommended. Pancreas: Unremarkable. No pancreatic ductal dilatation or surrounding inflammatory changes. Spleen: Normal in size without focal abnormality. Adrenals/Urinary Tract: The adrenal glands are unremarkable. There is no hydronephrosis on either side. There is symmetric enhancement and excretion  of contrast by both kidneys. Several hypodense lesion in the left kidney measure up to 1.5 cm in the inferior pole. The larger lesions demonstrate fluid attenuation most consistent with cysts and the smaller lesions are too small to characterize. Ultrasound may provide better evaluation on a nonemergent basis. The visualized ureters and urinary bladder appear unremarkable. Stomach/Bowel: There is no bowel obstruction or active inflammation. The appendix is normal. Vascular/Lymphatic: Moderate aortoiliac atherosclerotic disease. The IVC is grossly unremarkable. No portal venous gas. There is no adenopathy. Reproductive: The prostate and  seminal vesicles are grossly unremarkable. No pelvic mass Other: Diffuse skin thickening and stranding of the subcutaneous soft tissues of the anterior abdominal wall. No fluid collection. Musculoskeletal: Degenerative changes of the spine. No acute osseous pathology. IMPRESSION: 1. Choledocholithiasis with dilatation of the biliary tree and findings concerning for superimposed cholangitis. Further evaluation with MRCP is recommended. 2. No bowel obstruction or active inflammation. Normal appendix. Aortic Atherosclerosis (ICD10-I70.0). Electronically Signed   By: Anner Crete M.D.   On: 06/19/2019 22:55   Dg Ercp Biliary & Pancreatic Ducts  Result Date: 06/24/2019 CLINICAL DATA:  Sphincterotomy with stone removal EXAM: ERCP TECHNIQUE: Multiple spot images obtained with the fluoroscopic device and submitted for interpretation post-procedure. FLUOROSCOPY TIME:  Fluoroscopy Time:  3 minutes and 36 seconds Radiation Exposure Index (if provided by the fluoroscopic device): 116 Number of Acquired Spot Images: 9 COMPARISON:  CT dated 06/18/2001 FINDINGS: The patient has undergone ERCP with balloon sweep of the common bile duct. Final images demonstrate no convincing filling defect within the common bile duct. IMPRESSION: Status post ERCP as above. These images were submitted for radiologic interpretation only. Please see the procedural report for the amount of contrast and the fluoroscopy time utilized. Electronically Signed   By: Constance Holster M.D.   On: 06/24/2019 12:02    Microbiology: Recent Results (from the past 240 hour(s))  SARS CORONAVIRUS 2 (TAT 6-24 HRS) Nasopharyngeal Nasopharyngeal Swab     Status: None   Collection Time: 06/19/19 11:43 PM   Specimen: Nasopharyngeal Swab  Result Value Ref Range Status   SARS Coronavirus 2 NEGATIVE NEGATIVE Final    Comment: (NOTE) SARS-CoV-2 target nucleic acids are NOT DETECTED. The SARS-CoV-2 RNA is generally detectable in upper and  lower respiratory specimens during the acute phase of infection. Negative results do not preclude SARS-CoV-2 infection, do not rule out co-infections with other pathogens, and should not be used as the sole basis for treatment or other patient management decisions. Negative results must be combined with clinical observations, patient history, and epidemiological information. The expected result is Negative. Fact Sheet for Patients: SugarRoll.be Fact Sheet for Healthcare Providers: https://www.woods-mathews.com/ This test is not yet approved or cleared by the [place] FDA and  has been authorized for detection and/or diagnosis of SARS-CoV-2 by FDA under an Emergency Use Authorization (EUA). This EUA will remain  in effect (meaning this test can be used) for the duration of the COVID-19 declaration under Section 56 4(b)(1) of the Act, 21 U.S.C. section 360bbb-3(b)(1), unless the authorization is terminated or revoked sooner. Performed at [facility], [place] [address] [place], [place] [address]      Labs: Basic Metabolic Panel: Recent Labs  Lab 06/21/19 [phone] 06/22/19 0351 06/23/19 0251 06/24/19 0324 06/25/19 0335  NA 133* 135 136 136 136  K 4.6 4.1 4.5 4.1 4.2  CL 101 102 101 101 101  CO2 24 26 27 25 25   GLUCOSE 203* 226* 158* 183* 146*  BUN 16 18 17 17 19   CREATININE 0.74 1.10 0.75 0.86 1.03  CALCIUM 9.0 8.7* 8.9 9.1 8.7*   Liver Function Tests: Recent Labs  Lab 06/21/19 0619 06/22/19 0351 06/23/19 0251 06/24/19 0324 06/25/19 0335  AST 96* 76* 74* 66* 46*  ALT 167* 149* 137* 129* 95*  ALKPHOS 324* 309* 308* 310* 275*  BILITOT 12.5* 10.9* 12.5* 12.0* 8.1*  PROT 6.0* 6.3* 6.4* 6.9 6.3*  ALBUMIN 3.0* 3.0* 3.0* 3.2* 3.0*   Recent Labs  Lab 06/19/19 1704  LIPASE 28   No results for input(s): AMMONIA in the last 168 hours. CBC: Recent Labs  Lab 06/19/19 1024 06/19/19 1704 06/21/19 0619 06/23/19 0251  06/24/19 0324 06/25/19 0335  WBC 12.6* 11.8* 7.7 8.4 8.9 8.8  NEUTROABS 9.5*  --   --   --   --   --   HGB 17.8* 17.6* 16.1 15.7 16.4 15.9  HCT 53.0* 53.1* 49.0 46.7 48.7 46.4  MCV 92.1 92.8 94.0 91.9 91.7 90.8  PLT 188.0 185 164 188 201 178   Cardiac Enzymes: No results for input(s): CKTOTAL, CKMB, CKMBINDEX, TROPONINI in the last 168 hours. BNP: BNP (last 3 results) No results for input(s): BNP in the last 8760 hours.  ProBNP (last 3 results) No results for input(s): PROBNP in the last 8760 hours.  CBG: Recent Labs  Lab 06/24/19 1106 06/24/19 1157 06/24/19 1647 06/24/19 2146 06/25/19 0735  GLUCAP 240* 234* 241* 153* 151*       Signed:  Nita Sells MD   [facility] 06/25/2019, 9:20 AM

## 2019-07-08 ENCOUNTER — Other Ambulatory Visit (INDEPENDENT_AMBULATORY_CARE_PROVIDER_SITE_OTHER): Payer: BC Managed Care – PPO

## 2019-07-08 DIAGNOSIS — R7989 Other specified abnormal findings of blood chemistry: Secondary | ICD-10-CM

## 2019-07-08 LAB — CBC WITH DIFFERENTIAL/PLATELET
Basophils Absolute: 0 10*3/uL (ref 0.0–0.1)
Basophils Relative: 0.2 % (ref 0.0–3.0)
Eosinophils Absolute: 0.4 10*3/uL (ref 0.0–0.7)
Eosinophils Relative: 3.4 % (ref 0.0–5.0)
HCT: 45.1 % (ref 39.0–52.0)
Hemoglobin: 15.4 g/dL (ref 13.0–17.0)
Lymphocytes Relative: 29.3 % (ref 12.0–46.0)
Lymphs Abs: 3.2 10*3/uL (ref 0.7–4.0)
MCHC: 34.3 g/dL (ref 30.0–36.0)
MCV: 91.4 fl (ref 78.0–100.0)
Monocytes Absolute: 0.8 10*3/uL (ref 0.1–1.0)
Monocytes Relative: 7.4 % (ref 3.0–12.0)
Neutro Abs: 6.5 10*3/uL (ref 1.4–7.7)
Neutrophils Relative %: 59.7 % (ref 43.0–77.0)
Platelets: 223 10*3/uL (ref 150.0–400.0)
RBC: 4.93 Mil/uL (ref 4.22–5.81)
RDW: 14.4 % (ref 11.5–15.5)
WBC: 10.8 10*3/uL — ABNORMAL HIGH (ref 4.0–10.5)

## 2019-07-08 LAB — COMPREHENSIVE METABOLIC PANEL
ALT: 62 U/L — ABNORMAL HIGH (ref 0–53)
AST: 42 U/L — ABNORMAL HIGH (ref 0–37)
Albumin: 4.2 g/dL (ref 3.5–5.2)
Alkaline Phosphatase: 124 U/L — ABNORMAL HIGH (ref 39–117)
BUN: 31 mg/dL — ABNORMAL HIGH (ref 6–23)
CO2: 26 mEq/L (ref 19–32)
Calcium: 9.8 mg/dL (ref 8.4–10.5)
Chloride: 100 mEq/L (ref 96–112)
Creatinine, Ser: 0.96 mg/dL (ref 0.40–1.50)
GFR: 79.59 mL/min (ref >60.00)
Glucose, Bld: 233 mg/dL — ABNORMAL HIGH (ref 70–99)
Potassium: 5 mEq/L (ref 3.5–5.1)
Sodium: 135 mEq/L (ref 135–145)
Total Bilirubin: 3.1 mg/dL — ABNORMAL HIGH (ref 0.2–1.2)
Total Protein: 7.2 g/dL (ref 6.0–8.3)

## 2019-07-09 ENCOUNTER — Other Ambulatory Visit: Payer: Self-pay | Admitting: *Deleted

## 2019-07-09 DIAGNOSIS — R7989 Other specified abnormal findings of blood chemistry: Secondary | ICD-10-CM

## 2019-07-26 ENCOUNTER — Ambulatory Visit: Payer: BC Managed Care – PPO | Admitting: Gastroenterology

## 2019-07-26 ENCOUNTER — Other Ambulatory Visit (INDEPENDENT_AMBULATORY_CARE_PROVIDER_SITE_OTHER): Payer: BC Managed Care – PPO

## 2019-07-26 DIAGNOSIS — R7989 Other specified abnormal findings of blood chemistry: Secondary | ICD-10-CM

## 2019-07-26 LAB — HEPATIC FUNCTION PANEL
ALT: 28 U/L (ref 0–53)
AST: 18 U/L (ref 0–37)
Albumin: 4.4 g/dL (ref 3.5–5.2)
Alkaline Phosphatase: 86 U/L (ref 39–117)
Bilirubin, Direct: 0.5 mg/dL — ABNORMAL HIGH (ref 0.0–0.3)
Total Bilirubin: 1.4 mg/dL — ABNORMAL HIGH (ref 0.2–1.2)
Total Protein: 7.3 g/dL (ref 6.0–8.3)

## 2019-07-30 ENCOUNTER — Ambulatory Visit: Payer: BC Managed Care – PPO | Admitting: Gastroenterology

## 2019-07-30 ENCOUNTER — Encounter: Payer: Self-pay | Admitting: Gastroenterology

## 2019-07-30 VITALS — BP 124/78 | HR 76 | Temp 98.1°F | Ht 74.0 in | Wt 219.0 lb

## 2019-07-30 DIAGNOSIS — R945 Abnormal results of liver function studies: Secondary | ICD-10-CM | POA: Diagnosis not present

## 2019-07-30 DIAGNOSIS — K8050 Calculus of bile duct without cholangitis or cholecystitis without obstruction: Secondary | ICD-10-CM

## 2019-07-30 DIAGNOSIS — Z1159 Encounter for screening for other viral diseases: Secondary | ICD-10-CM

## 2019-07-30 DIAGNOSIS — R7989 Other specified abnormal findings of blood chemistry: Secondary | ICD-10-CM

## 2019-07-30 DIAGNOSIS — Z86010 Personal history of colonic polyps: Secondary | ICD-10-CM

## 2019-07-30 MED ORDER — NA SULFATE-K SULFATE-MG SULF 17.5-3.13-1.6 GM/177ML PO SOLN: 1.0000 | Freq: Once | ORAL | 0 refills | Status: AC

## 2019-07-30 NOTE — Progress Notes (Signed)
[facility] Progress Note  Chief Complaint: Choledocholithiasis  Subjective  History: Recent admission for obstructive jaundice from choledocholithiasis. ERCP by Dr. Lyndel Safe on 06/24/2019 removed multiple bile duct stones.  Ricky Cabrera recently came to clinic for abdominal pain and jaundice, he was admitted to the hospital for choledocholithiasis and concerns for cholangitis.  He was treated with antibiotics, he finally had ERCP went off Plavix 5 days.  Since then he has been feeling well, with no recurrent abdominal pain, jaundice and icterus resolved. He denies altered bowel habits or rectal bleeding.  No nausea or vomiting today.  He had a colonoscopy with me in August 2019 with multiple colon adenomatous polyps removed, including a 20 mm polyp removed piecemeal.  Plans were for follow-up colonoscopy in 6 months, but that was delayed due to Covid.   ROS: Cardiovascular:  no chest pain Respiratory: no dyspnea Intermittent constipation Remainder systems negative except as above  The patient's Past Medical, Family and Social History were reviewed and are on file in the EMR.  Objective:  Med list reviewed  Current Outpatient Medications:  .  atenolol (TENORMIN) 100 MG tablet, Take 100 mg by mouth daily., Disp: , Rfl:  .  clopidogrel (PLAVIX) 75 MG tablet, Take 1 tablet (75 mg total) by mouth daily., Disp:  , Rfl:  .  insulin detemir (LEVEMIR) 100 UNIT/ML injection, Inject 0.25 mLs (25 Units total) into the skin daily., Disp: 10 mL, Rfl: 11 .  lisinopril (PRINIVIL,ZESTRIL) 2.5 MG tablet, Take 2.5 mg by mouth daily., Disp: , Rfl:  .  Oxycodone HCl 10 MG TABS, Take 1 tablet by mouth 3 (three) times daily., Disp: , Rfl:  .  senna-docusate (SENOKOT-S) 8.6-50 MG tablet, Take 1 tablet by mouth at bedtime as needed for mild constipation., Disp: 20 tablet, Rfl: 0 .  sildenafil (VIAGRA) 100 MG tablet, Take 100 mg by mouth every other day., Disp: , Rfl:  .  Vitamin D, Ergocalciferol,  (DRISDOL) 50000 units CAPS capsule, Take 50,000 Units by mouth every 7 (seven) days. , Disp: , Rfl:  .  Na Sulfate-K Sulfate-Mg Sulf 17.5-3.13-1.6 GM/177ML SOLN, Take 1 kit by mouth once for 1 dose., Disp: 354 mL, Rfl: 0   Vital signs in last 24 hrs: Vitals:   07/30/19 1329  BP: 124/78  Pulse: 76  Temp: 98.1 F (36.7 C)  SpO2: 97%    Physical Exam  Well-appearing, and accompanied by his wife.  HEENT: sclera anicteric, oral mucosa moist without lesions  Neck: supple, no thyromegaly, JVD or lymphadenopathy  Cardiac: RRR without murmurs, S1S2 heard, no peripheral edema  Pulm: clear to auscultation bilaterally, normal RR and effort noted  Abdomen: soft, no tenderness, with active bowel sounds. No guarding or palpable hepatosplenomegaly.  Skin; warm and dry, no jaundice or rash  Recent Labs:  CMP Latest Ref Rng & Units 07/26/2019 07/08/2019 06/25/2019  Glucose 70 - 99 mg/dL - 233(H) 146(H)  BUN 6 - 23 mg/dL - 31(H) 19  Creatinine 0.40 - 1.50 mg/dL - 0.96 1.03  Sodium 135 - 145 mEq/L - 135 136  Potassium 3.5 - 5.1 mEq/L - 5.0 4.2  Chloride 96 - 112 mEq/L - 100 101  CO2 19 - 32 mEq/L - 26 25  Calcium 8.4 - 10.5 mg/dL - 9.8 8.7(L)  Total Protein 6.0 - 8.3 g/dL 7.3 7.2 6.3(L)  Total Bilirubin 0.2 - 1.2 mg/dL 1.4(H) 3.1(H) 8.1(H)  Alkaline Phos 39 - 117 U/L 86 124(H) 275(H)  AST 0 - 37 U/L  18 42(H) 46(H)  ALT 0 - 53 U/L 28 62(H) 95(H)     Radiologic studies:  CLINICAL DATA:  Sphincterotomy with stone removal   EXAM: ERCP   TECHNIQUE: Multiple spot images obtained with the fluoroscopic device and submitted for interpretation post-procedure.   FLUOROSCOPY TIME:  Fluoroscopy Time:  3 minutes and 36 seconds   Radiation Exposure Index (if provided by the fluoroscopic device): 116   Number of Acquired Spot Images: 9   COMPARISON:  CT dated 06/18/2001   FINDINGS: The patient has undergone ERCP with balloon sweep of the common bile duct. Final images demonstrate no  convincing filling defect within the common bile duct.   IMPRESSION: Status post ERCP as above.   These images were submitted for radiologic interpretation only. Please see the procedural report for the amount of contrast and the fluoroscopy time utilized.     Electronically Signed   By: Constance Holster M.D.   On: 06/24/2019 12:02  (Procedure discussed with Dr. Lyndel Safe.)  '@ASSESSMENTPLANBEGIN' @ Assessment: Encounter Diagnoses  Name Primary?  . Choledocholithiasis Yes  . LFTs abnormal   . Personal history of colonic polyps   . Screening for viral disease    We discussed the nature of CBD stones after prior cholecystectomy.  Risk of recurrence is now greatly decreased after endoscopic sphincterotomy.  He now knows what those symptoms feel like, and will alert [place] if he thinks it is coming back.   Plan: He is overdue for a surveillance colonoscopy, and was agreeable to scheduling that today. Needs to be off Plavix 5 days prior, and understands the risks of that.  He was able to do so for his recent ERCP.   Total time 25 minutes, over half spent face-to-face with patient in counseling and coordination of care.   [name]

## 2019-07-30 NOTE — Patient Instructions (Addendum)
If you are age 61 or older, your body mass index should be between 23-30. Your Body mass index is 28.12 kg/m. If this is out of the aforementioned range listed, please consider follow up with your Primary Care Provider.  If you are age 52 or younger, your body mass index should be between 19-25. Your Body mass index is 28.12 kg/m. If this is out of the aformentioned range listed, please consider follow up with your Primary Care Provider.   You have been scheduled for a colonoscopy. Please follow written instructions given to you at your visit today.  Please pick up your prep supplies at the pharmacy within the next 1-3 days. If you use inhalers (even only as needed), please bring them with you on the day of your procedure.  Due to recent COVID-19 restrictions implemented by [employer] and state authorities and in an effort to keep both patients and staff as safe as possible, [place] requires COVID-19 testing prior to any scheduled endoscopic procedure. The testing center is located at [address] [place], [place] [address] in the [facility] [employer]  suite.  Your appointment has been scheduled for 08-26-2019 at 11am.   Please bring your insurance cards to this appointment. You will require your COVID screen 2 business days prior to your endoscopic procedure.  You are not required to quarantine after your screening.  You will only receive a phone call with the results if it is POSITIVE.  If you do not receive a call the day before your procedure you should begin your prep, if ordered, and you should report to the endo center for your procedure at your designated appointment arrival time ( one hour prior to the procedure time). There is no cost to you for the screening on the day of the swab.  [facility] will file with your insurance company for the testing.    You may receive an automated phone call prior to your procedure or  have a message in your MyChart that you have an appointment for a BP/15 at the [facility], please disregard this message.  Your testing will be at the [place] [place] location.   If you are leaving [facility] travel [place] on [address], turn left onto [address], turn night onto [address] at the 1st stop light turn right, pass the [employer] on your right and proceed to [place] (white building).   You will be contacted by our office prior to your procedure for directions on holding your Plavix.  If you do not hear from our office 1 week prior to your scheduled procedure, please call [phone] to discuss.   It was a pleasure to see you today!  Dr. Loletha Carrow

## 2019-08-02 ENCOUNTER — Telehealth: Payer: Self-pay

## 2019-08-02 NOTE — Telephone Encounter (Signed)
Plavix clearance letter has been faxed to Dr Antonietta Jewel.

## 2019-08-26 ENCOUNTER — Ambulatory Visit (INDEPENDENT_AMBULATORY_CARE_PROVIDER_SITE_OTHER): Payer: BC Managed Care – PPO

## 2019-08-26 ENCOUNTER — Other Ambulatory Visit: Payer: Self-pay | Admitting: Gastroenterology

## 2019-08-26 DIAGNOSIS — Z1159 Encounter for screening for other viral diseases: Secondary | ICD-10-CM

## 2019-08-27 LAB — SARS CORONAVIRUS 2 (TAT 6-24 HRS): SARS Coronavirus 2: NEGATIVE

## 2019-08-28 ENCOUNTER — Other Ambulatory Visit: Payer: Self-pay

## 2019-08-28 ENCOUNTER — Ambulatory Visit (AMBULATORY_SURGERY_CENTER): Payer: BC Managed Care – PPO | Admitting: Gastroenterology

## 2019-08-28 ENCOUNTER — Encounter: Payer: Self-pay | Admitting: Gastroenterology

## 2019-08-28 VITALS — BP 109/61 | HR 70 | Temp 98.3°F | Resp 18 | Ht 74.0 in | Wt 219.0 lb

## 2019-08-28 DIAGNOSIS — D123 Benign neoplasm of transverse colon: Secondary | ICD-10-CM

## 2019-08-28 DIAGNOSIS — Z86010 Personal history of colonic polyps: Secondary | ICD-10-CM | POA: Diagnosis present

## 2019-08-28 DIAGNOSIS — D122 Benign neoplasm of ascending colon: Secondary | ICD-10-CM

## 2019-08-28 MED ORDER — SODIUM CHLORIDE 0.9 % IV SOLN: 500.0000 mL | Freq: Once | INTRAVENOUS | Status: DC

## 2019-08-28 NOTE — Progress Notes (Signed)
VS-DT Temp-LC  

## 2019-08-28 NOTE — Progress Notes (Signed)
Pt tolerated well. VSS. Awake and to recovery. 

## 2019-08-28 NOTE — Patient Instructions (Signed)
HANDOUTS PROVIDED ON: POLYPS & DIVERTICULOSIS  THE POLYPS REMOVED TODAY HAVE BEEN SENT FOR PATHOLOGY.  THE RESULTS CAN TAKE 2-3 WEEKS TO RECEIVE.   YOU MAY RESUME YOUR PREVIOUS DIET.  YOU MAY RESUME YOUR PLAVIX ON Thursday 08/29/19.  ALL OTHER MEDICATIONS CAN BE RESUMED TODAY.  REPEAT COLONOSCOPY IN 1 YEAR.  [place] YOU FOR ALLOWING [place] TO CARE FOR YOU TODAY!!!  YOU HAD AN ENDOSCOPIC PROCEDURE TODAY AT [facility]:   Refer to the procedure report that was given to you for any specific questions about what was found during the examination.  If the procedure report does not answer your questions, please call your gastroenterologist to clarify.  If you requested that your care partner not be given the details of your procedure findings, then the procedure report has been included in a sealed envelope for you to review at your convenience later.  YOU SHOULD EXPECT: Some feelings of bloating in the abdomen. Passage of more gas than usual.  Walking can help get rid of the air that was put into your GI tract during the procedure and reduce the bloating. If you had a lower endoscopy (such as a colonoscopy or flexible sigmoidoscopy) you may notice spotting of blood in your stool or on the toilet paper. If you underwent a bowel prep for your procedure, you may not have a normal bowel movement for a few days.  Please Note:  You might notice some irritation and congestion in your nose or some drainage.  This is from the oxygen used during your procedure.  There is no need for concern and it should clear up in a day or so.  SYMPTOMS TO REPORT IMMEDIATELY:   Following lower endoscopy (colonoscopy or flexible sigmoidoscopy):  Excessive amounts of blood in the stool  Significant tenderness or worsening of abdominal pains  Swelling of the abdomen that is new, acute  Fever of 100F or higher  For urgent or emergent issues, a gastroenterologist can be reached at any hour by calling [phone]  [phone].   DIET:  We do recommend a small meal at first, but then you may proceed to your regular diet.  Drink plenty of fluids but you should avoid alcoholic beverages for 24 hours.  ACTIVITY:  You should plan to take it easy for the rest of today and you should NOT DRIVE or use heavy machinery until tomorrow (because of the sedation medicines used during the test).    FOLLOW UP: Our staff will call the number listed on your records 48-72 hours following your procedure to check on you and address any questions or concerns that you may have regarding the information given to you following your procedure. If we do not reach you, we will leave a message.  We will attempt to reach you two times.  During this call, we will ask if you have developed any symptoms of COVID 19. If you develop any symptoms (ie: fever, flu-like symptoms, shortness of breath, cough etc.) before then, please call [phone].  If you test positive for Covid 19 in the 2 weeks post procedure, please call and report this information to [place].    If any biopsies were taken you will be contacted by phone or by letter within the next 1-3 weeks.  Please call us at [phone] if you have not heard about the biopsies in 3 weeks.    SIGNATURES/CONFIDENTIALITY: You and/or your care partner have signed paperwork which will be entered into your electronic medical  record.  These signatures attest to the fact that that the information above on your After Visit Summary has been reviewed and is understood.  Full responsibility of the confidentiality of this discharge information lies with you and/or your care-partner.

## 2019-08-28 NOTE — Op Note (Signed)
Media Patient Name: Ricky Cabrera Procedure Date: 08/28/2019 9:12 AM MRN: [id number] Endoscopist: Mallie Mussel L. 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 The patient's tolerance of                            previous anesthesia was also reviewed. The risks                            and benefits of the procedure and the sedation                            options and risks were discussed with the patient.                            All questions were answered, and informed consent                            was obtained. Prior Anticoagulants: The patient has                            taken Plavix (clopidogrel), last dose was 5 days                            prior to procedure. ASA Grade Assessment: III - A                            patient with severe systemic disease. After  reviewing the risks and benefits, the patient was                            deemed in satisfactory condition to undergo the                            procedure.                           After obtaining informed consent, the colonoscope                            was passed under direct vision. Throughout the                            procedure, the patient's blood pressure, pulse, and              oxygen saturations were monitored continuously. The                            Colonoscope was introduced through the anus and                            advanced to the the cecum, identified by                            appendiceal orifice and ileocecal valve. The                            colonoscopy was somewhat difficult due to poor                            bowel prep and a redundant colon. Successful                            completion of the procedure was aided by using                            manual pressure and lavage. The patient tolerated                            the procedure well. The quality of the bowel                            preparation was poor (Suprep). The ileocecal valve,                            appendiceal orifice, and rectum were photographed. Scope In: 9:18:52 AM Scope Out: 9:40:51 AM Scope Withdrawal Time: 0 hours 14 minutes 4 seconds  Total Procedure Duration: 0 hours 21 minutes 59 seconds  Findings:                 The perianal and digital rectal examinations were  normal.                           A tattoo was seen at the hepatic flexure. The                            tattoo site and area just proximal to that (former                            polypectomy site) appeared normal.                           Two sessile polyps were found in the ascending                            colon. The polyps were diminutive in size. These                            polyps were removed with a cold biopsy forceps.                            Resection and retrieval were complete.                           A 4 mm polyp was found in the transverse colon. The                            polyp was sessile. The polyp was removed with a                            cold snare. Resection and retrieval were complete.                           Many diverticula were found in the left colon.                           The exam was  otherwise without abnormality on                            direct and retroflexion views. Complications:            No immediate complications. Estimated Blood Loss:     Estimated blood loss: none. Estimated blood loss                            was minimal. Impression:               - Preparation of the colon was poor.                           - A tattoo was seen at the hepatic flexure. The                            tattoo site appeared normal.                           -  Two diminutive polyps in the ascending colon,                            removed with a cold biopsy forceps. Resected and                            retrieved.                           - One 4 mm polyp in the transverse colon, removed                            with a cold snare. Resected and retrieved.                           - Diverticulosis in the left colon.                           - The examination was otherwise normal on direct                            and retroflexion views. Recommendation:           - Patient has a contact number available for                            emergencies. The signs and symptoms of potential                            delayed complications were discussed with the                            patient. Return to normal activities tomorrow.                            Written discharge instructions were provided to the                            patient.                           - Resume previous diet.                           - Await pathology results.                           - Repeat colonoscopy in 1 year for surveillance due                            to poor preparation (NEEDS 4 LITER PEG-PREP FOR                            NEXT EXAM).                           -  Resume Plavix (clopidogrel) at prior dose                            tomorrow. [name], MD 08/28/2019 9:48:27 AM This report has been signed electronically.

## 2019-08-28 NOTE — Progress Notes (Signed)
Called to room to assist during endoscopic procedure.  Patient ID and intended procedure confirmed with present staff. Received instructions for my participation in the procedure from the performing physician.  

## 2019-08-30 ENCOUNTER — Telehealth: Payer: Self-pay | Admitting: *Deleted

## 2019-08-30 ENCOUNTER — Telehealth: Payer: Self-pay

## 2019-08-30 NOTE — Telephone Encounter (Signed)
Message left

## 2019-08-30 NOTE — Telephone Encounter (Signed)
LVM

## 2019-09-03 ENCOUNTER — Encounter: Payer: Self-pay | Admitting: Gastroenterology

## 2020-11-06 ENCOUNTER — Encounter: Payer: Self-pay | Admitting: Gastroenterology

## 2021-04-06 IMAGING — CT CT ABD-PELV W/ CM
2 of 5 series · 15 of 46 positions shown, 17 images · IV contrast (omnipaque)
Comparison: Abdominal ultrasound dated 01/12/2017

CLINICAL DATA: 63-year-old male with known cholangitis presenting
for follow-up.

EXAM:
CT ABDOMEN AND PELVIS WITH CONTRAST
TECHNIQUE: Multidetector CT imaging of the abdomen and pelvis was performed
using the standard protocol following bolus administration of
intravenous contrast.
CONTRAST:  100mL OMNIPAQUE IOHEXOL 300 MG/ML  SOLN

[Series 2: axial st · axial · 0.94mm/px · z∈[-809,-344]mm · 12 of 109 slices shown, 14 images]
[im 8/109  soft-tissue]
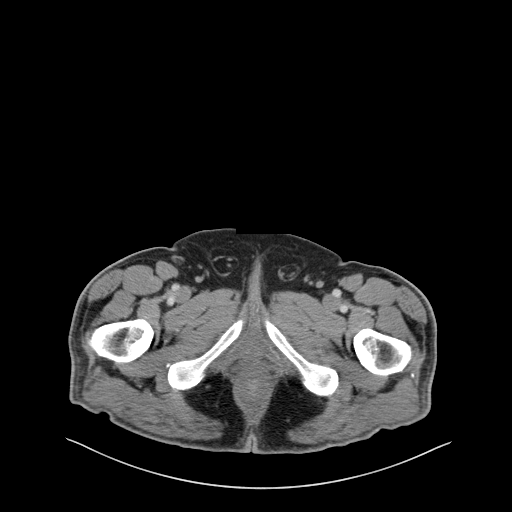
[im 8/109  bone]
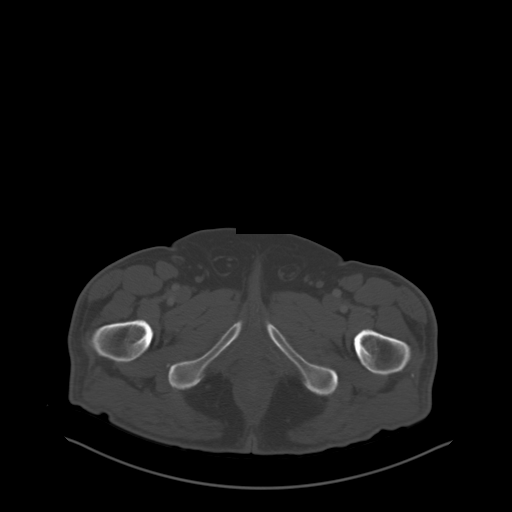
[im 16/109  soft-tissue]
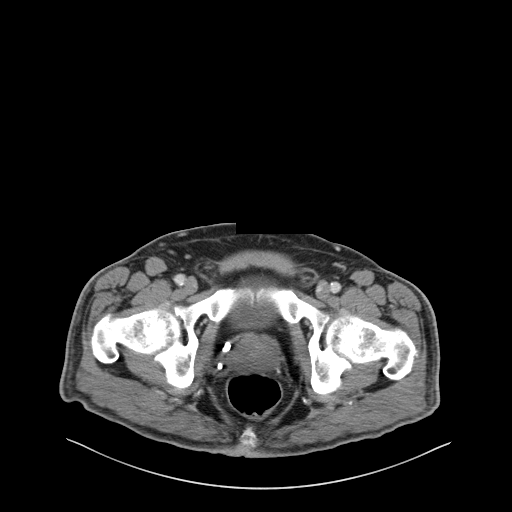
[im 24/109  soft-tissue]
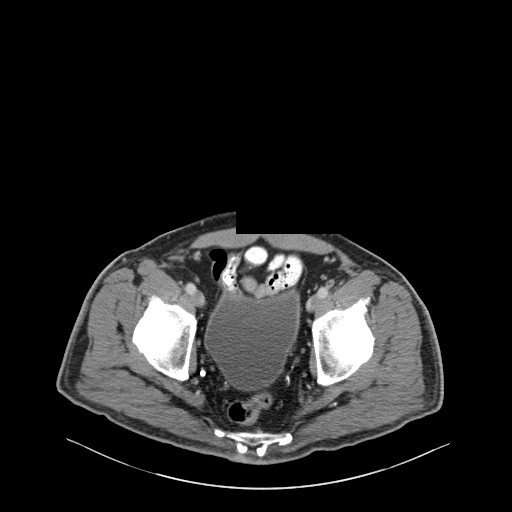
[im 31/109  soft-tissue]
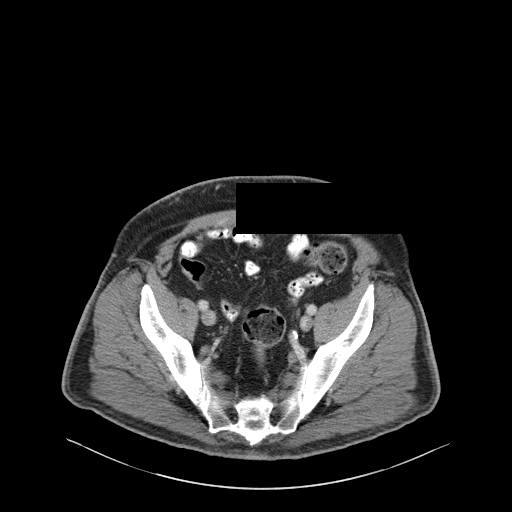
[im 39/109  soft-tissue]
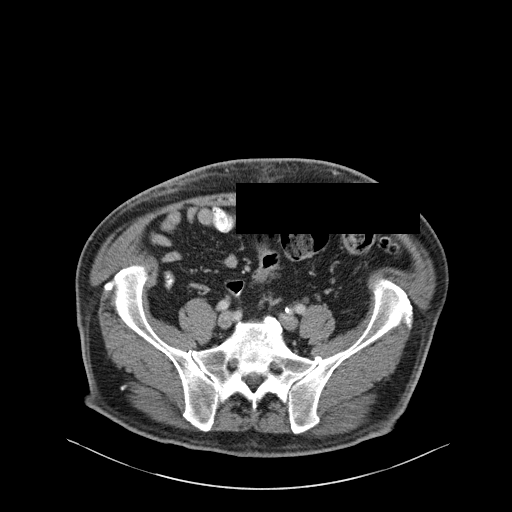
[im 47/109  soft-tissue]
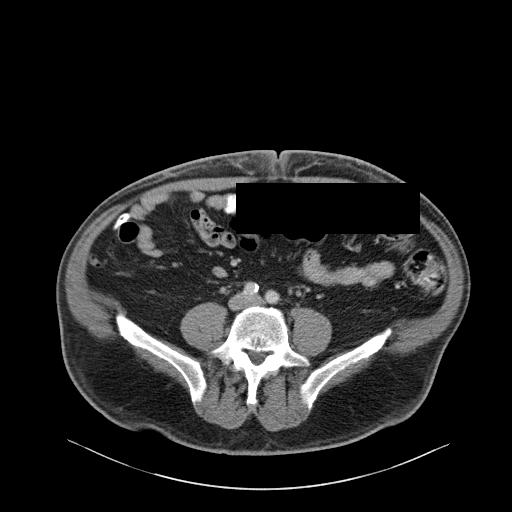
[im 62/109  soft-tissue]
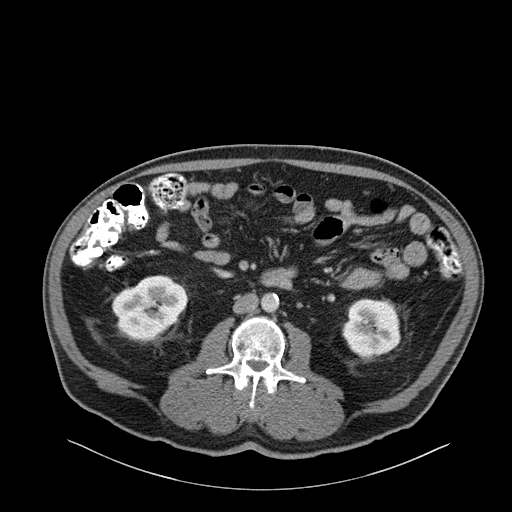
[im 70/109  soft-tissue]
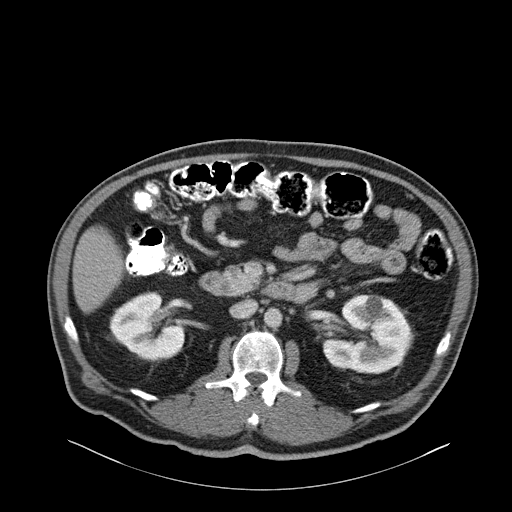
[im 78/109  soft-tissue]
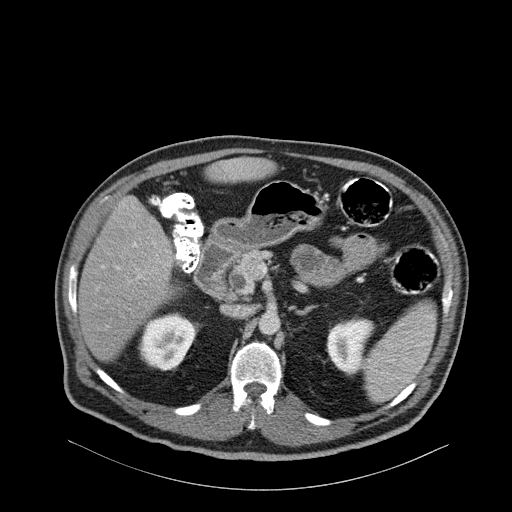
[im 78/109  bone]
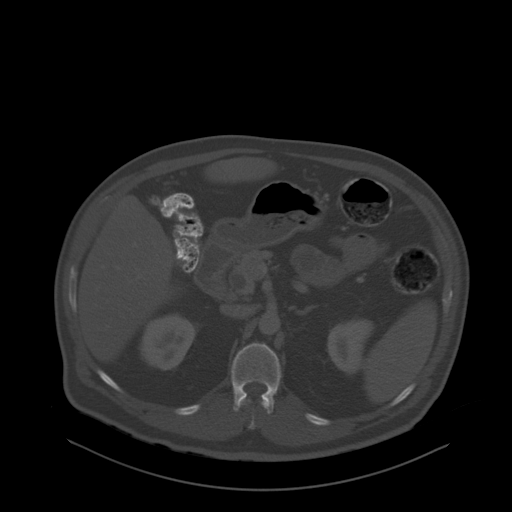
[im 85/109  soft-tissue]
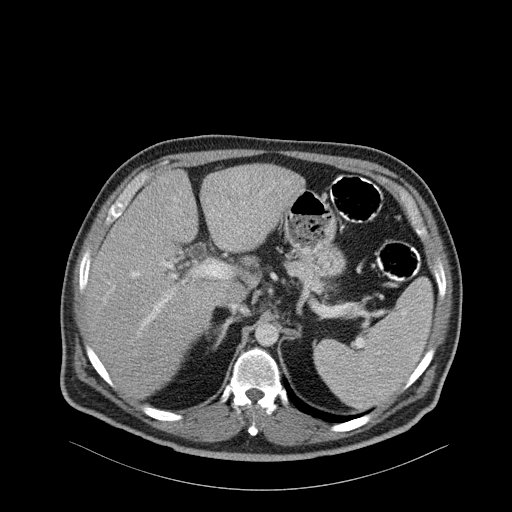
[im 93/109  soft-tissue]
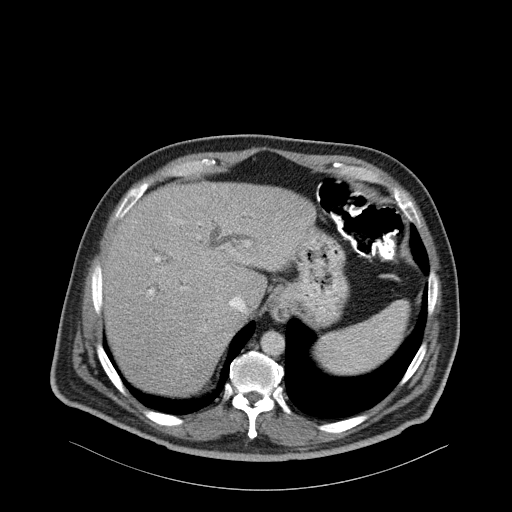
[im 101/109  soft-tissue]
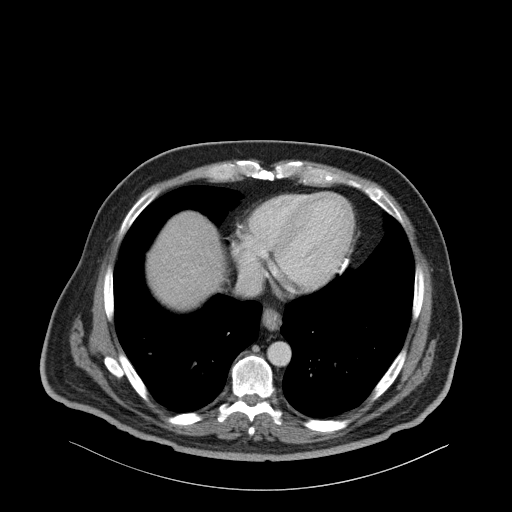

[Series 5: coronal st · coronal · 0.85mm/px · 3 of 154 slices shown]
[im 52/154  soft-tissue]
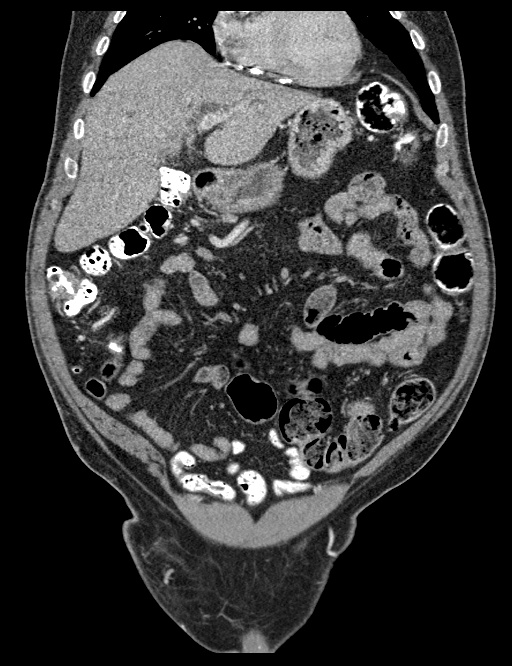
[im 69/154  soft-tissue]
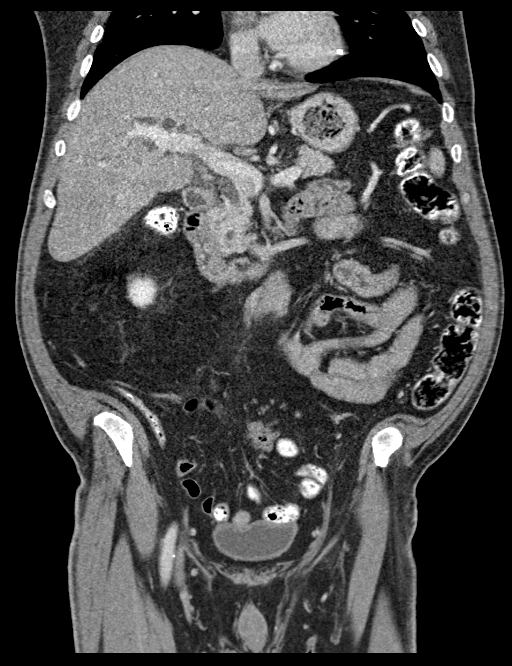
[im 86/154  soft-tissue]
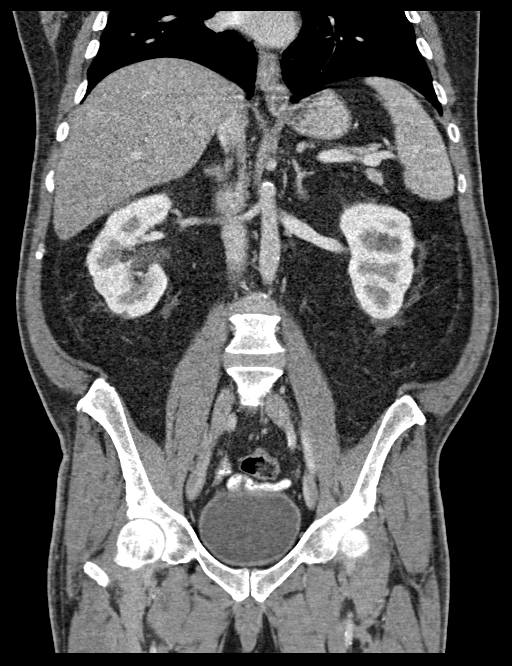

[15 of 46 positions shown; findings below may reference images not displayed]

FINDINGS: Lower chest: The visualized lung bases are clear. Advanced multi
vessel coronary vascular calcification noted.

Intra-abdominal free air or free fluid.

Hepatobiliary: Apparent fatty infiltration of the liver. There is
dilatation of the intrahepatic biliary tree as well as common bile
duct. Several noncalcified adjacent stones noted in the central CBD
with a combined length of 18 mm (coronal series 5 image 76). The
common bile duct measures approximately 11 mm in diameter. Mild
haziness of the fat surrounding the biliary tree concerning for
superimposed infection/cholangitis. Further evaluation with MRCP is
recommended.

Pancreas: Unremarkable. No pancreatic ductal dilatation or
surrounding inflammatory changes.

Spleen: Normal in size without focal abnormality.

Adrenals/Urinary Tract: The adrenal glands are unremarkable. There
is no hydronephrosis on either side. There is symmetric enhancement
and excretion of contrast by both kidneys. Several hypodense lesion
in the left kidney measure up to 1.5 cm in the inferior pole. The
larger lesions demonstrate fluid attenuation most consistent with
cysts and the smaller lesions are too small to characterize.
Ultrasound may provide better evaluation on a nonemergent basis. The
visualized ureters and urinary bladder appear unremarkable.

Stomach/Bowel: There is no bowel obstruction or active inflammation.
The appendix is normal.

Vascular/Lymphatic: Moderate aortoiliac atherosclerotic disease. The
IVC is grossly unremarkable. No portal venous gas. There is no
adenopathy.

Reproductive: The prostate and seminal vesicles are grossly
unremarkable. No pelvic mass

Other: Diffuse skin thickening and stranding of the subcutaneous
soft tissues of the anterior abdominal wall. No fluid collection.

Musculoskeletal: Degenerative changes of the spine. No acute osseous
pathology.
IMPRESSION: 1. Choledocholithiasis with dilatation of the biliary tree and
findings concerning for superimposed cholangitis. Further evaluation
with MRCP is recommended.
2. No bowel obstruction or active inflammation. Normal appendix.

Aortic Atherosclerosis ([id number]).
